# Patient Record
Sex: Female | Born: 1985 | Race: White | Hispanic: No | Marital: Married | Smoking: Never smoker
Health system: Southern US, Community
[De-identification: ages and names within clinical notes are randomized; demographics above are authoritative.]

## PROBLEM LIST (undated history)

## (undated) DIAGNOSIS — Z3400 Encounter for supervision of normal first pregnancy, unspecified trimester: Principal | ICD-10-CM

## (undated) HISTORY — DX: Encounter for supervision of normal first pregnancy, unspecified trimester: Z34.00

## (undated) HISTORY — PX: WISDOM TOOTH EXTRACTION: SHX21

---

## 2017-04-09 ENCOUNTER — Other Ambulatory Visit: Payer: Self-pay | Admitting: Obstetrics and Gynecology

## 2017-04-09 DIAGNOSIS — O219 Vomiting of pregnancy, unspecified: Secondary | ICD-10-CM | POA: Insufficient documentation

## 2017-04-09 MED ORDER — DOXYLAMINE-PYRIDOXINE 10-10 MG PO TBEC
1.0000 | DELAYED_RELEASE_TABLET | Freq: Two times a day (BID) | ORAL | 0 refills | Status: DC
Start: 2017-04-09 — End: 2017-11-25

## 2017-04-09 NOTE — Progress Notes (Signed)
[redacted]wks EGA with persistent nausea, daily vomiting Rx Diclegis for N/V

## 2017-04-25 ENCOUNTER — Encounter: Payer: Self-pay | Admitting: Advanced Practice Midwife

## 2017-04-25 ENCOUNTER — Other Ambulatory Visit (HOSPITAL_COMMUNITY)
Admission: RE | Admit: 2017-04-25 | Discharge: 2017-04-25 | Disposition: A | Discharge status: Home / Self Care | Payer: 59 | Source: Ambulatory Visit | Attending: Advanced Practice Midwife | Admitting: Advanced Practice Midwife

## 2017-04-25 ENCOUNTER — Ambulatory Visit (INDEPENDENT_AMBULATORY_CARE_PROVIDER_SITE_OTHER): Payer: 59 | Admitting: Advanced Practice Midwife

## 2017-04-25 VITALS — BP 119/76 | HR 83 | Ht 67.0 in | Wt 136.0 lb

## 2017-04-25 DIAGNOSIS — Z1379 Encounter for other screening for genetic and chromosomal anomalies: Secondary | ICD-10-CM

## 2017-04-25 DIAGNOSIS — Z23 Encounter for immunization: Secondary | ICD-10-CM | POA: Diagnosis not present

## 2017-04-25 DIAGNOSIS — Z113 Encounter for screening for infections with a predominantly sexual mode of transmission: Secondary | ICD-10-CM | POA: Diagnosis not present

## 2017-04-25 DIAGNOSIS — Z124 Encounter for screening for malignant neoplasm of cervix: Secondary | ICD-10-CM

## 2017-04-25 DIAGNOSIS — Z3687 Encounter for antenatal screening for uncertain dates: Secondary | ICD-10-CM | POA: Diagnosis not present

## 2017-04-25 DIAGNOSIS — Z3401 Encounter for supervision of normal first pregnancy, first trimester: Secondary | ICD-10-CM | POA: Diagnosis not present

## 2017-04-25 DIAGNOSIS — Z3400 Encounter for supervision of normal first pregnancy, unspecified trimester: Secondary | ICD-10-CM | POA: Insufficient documentation

## 2017-04-25 DIAGNOSIS — Z1388 Encounter for screening for disorder due to exposure to contaminants: Secondary | ICD-10-CM

## 2017-04-25 HISTORY — DX: Encounter for supervision of normal first pregnancy, unspecified trimester: Z34.00

## 2017-04-25 NOTE — Patient Instructions (Addendum)
Prenatal Care WHAT IS PRENATAL CARE? Prenatal care is the process of caring for a pregnant woman before she gives birth. Prenatal care makes sure that she and her baby remain as healthy as possible throughout pregnancy. Prenatal care may be provided by a midwife, family practice health care provider, or a childbirth and pregnancy specialist (obstetrician). Prenatal care may include physical examinations, testing, treatments, and education on nutrition, lifestyle, and social support services. WHY IS PRENATAL CARE SO IMPORTANT? Early and consistent prenatal care increases the chance that you and your baby will remain healthy throughout your pregnancy. This type of care also decreases a baby's risk of being born too early (prematurely), or being born smaller than expected (small for gestational age). Any underlying medical conditions you may have that could pose a risk during your pregnancy are discussed during prenatal care visits. You will also be monitored regularly for any new conditions that may arise during your pregnancy so they can be treated quickly and effectively. WHAT HAPPENS DURING PRENATAL CARE VISITS? Prenatal care visits may include the following: Discussion Tell your health care provider about any new signs or symptoms you have experienced since your last visit. These might include:  Nausea or vomiting.  Increased or decreased level of energy.  Difficulty sleeping.  Back or leg pain.  Weight changes.  Frequent urination.  Shortness of breath with physical activity.  Changes in your skin, such as the development of a rash or itchiness.  Vaginal discharge or bleeding.  Feelings of excitement or nervousness.  Changes in your baby's movements.  You may want to write down any questions or topics you want to discuss with your health care provider and bring them with you to your appointment. Examination During your first prenatal care visit, you will likely have a complete  physical exam. Your health care provider will often examine your vagina, cervix, and the position of your uterus, as well as check your heart, lungs, and other body systems. As your pregnancy progresses, your health care provider will measure the size of your uterus and your baby's position inside your uterus. He or she may also examine you for early signs of labor. Your prenatal visits may also include checking your blood pressure and, after about 10-12 weeks of pregnancy, listening to your baby's heartbeat. Testing Regular testing often includes:  Urinalysis. This checks your urine for glucose, protein, or signs of infection.  Blood count. This checks the levels of white and red blood cells in your body.  Tests for sexually transmitted infections (STIs). Testing for STIs at the beginning of pregnancy is routinely done and is required in many states.  Antibody testing. You will be checked to see if you are immune to certain illnesses, such as rubella, that can affect a developing fetus.  Glucose screen. Around 24-28 weeks of pregnancy, your blood glucose level will be checked for signs of gestational diabetes. Follow-up tests may be recommended.  Group B strep. This is a bacteria that is commonly found inside a woman's vagina. This test will inform your health care provider if you need an antibiotic to reduce the amount of this bacteria in your body prior to labor and childbirth.  Ultrasound. Many pregnant women undergo an ultrasound screening around 18-20 weeks of pregnancy to evaluate the health of the fetus and check for any developmental abnormalities.  HIV (human immunodeficiency virus) testing. Early in your pregnancy, you will be screened for HIV. If you are at high risk for HIV, this test may   be repeated during your third trimester of pregnancy.  You may be offered other testing based on your age, personal or family medical history, or other factors. HOW OFTEN SHOULD I PLAN TO SEE MY  HEALTH CARE PROVIDER FOR PRENATAL CARE? Your prenatal care check-up schedule depends on any medical conditions you have before, or develop during, your pregnancy. If you do not have any underlying medical conditions, you will likely be seen for checkups:  Monthly, during the first 6 months of pregnancy.  Twice a month during months 7 and 8 of pregnancy.  Weekly starting in the 9th month of pregnancy and until delivery.  If you develop signs of early labor or other concerning signs or symptoms, you may need to see your health care provider more often. Ask your health care provider what prenatal care schedule is best for you. WHAT CAN I DO TO KEEP MYSELF AND MY BABY AS HEALTHY AS POSSIBLE DURING MY PREGNANCY?  Take a prenatal vitamin containing 400 micrograms (0.4 mg) of folic acid every day. Your health care provider may also ask you to take additional vitamins such as iodine, vitamin D, iron, copper, and zinc.  Take 1500-2000 mg of calcium daily starting at your 20th week of pregnancy until you deliver your baby.  Make sure you are up to date on your vaccinations. Unless directed otherwise by your health care provider: ? You should receive a tetanus, diphtheria, and pertussis (Tdap) vaccination between the 27th and 36th week of your pregnancy, regardless of when your last Tdap immunization occurred. This helps protect your baby from whooping cough (pertussis) after he or she is born. ? You should receive an annual inactivated influenza vaccine (IIV) to help protect you and your baby from influenza. This can be done at any point during your pregnancy.  Eat a well-rounded diet that includes: ? Fresh fruits and vegetables. ? Lean proteins. ? Calcium-rich foods such as milk, yogurt, hard cheeses, and dark, leafy greens. ? Whole grain breads.  Do noteat seafood high in mercury, including: ? Swordfish. ? Tilefish. ? Shark. ? King mackerel. ? More than 6 oz tuna per week.  Do not  eat: ? Raw or undercooked meats or eggs. ? Unpasteurized foods, such as soft cheeses (brie, blue, or feta), juices, and milks. ? Lunch meats. ? Hot dogs that have not been heated until they are steaming.  Drink enough water to keep your urine clear or pale yellow. For many women, this may be 10 or more 8 oz glasses of water each day. Keeping yourself hydrated helps deliver nutrients to your baby and may prevent the start of pre-term uterine contractions.  Do not use any tobacco products including cigarettes, chewing tobacco, or electronic cigarettes. If you need help quitting, ask your health care provider.  Do not drink beverages containing alcohol. No safe level of alcohol consumption during pregnancy has been determined.  Do not use any illegal drugs. These can harm your developing baby or cause a miscarriage.  Ask your health care provider or pharmacist before taking any prescription or over-the-counter medicines, herbs, or supplements.  Limit your caffeine intake to no more than 200 mg per day.  Exercise. Unless told otherwise by your health care provider, try to get 30 minutes of moderate exercise most days of the week. Do not  do high-impact activities, contact sports, or activities with a high risk of falling, such as horseback riding or downhill skiing.  Get plenty of rest.  Avoid anything that raises your  body temperature, such as hot tubs and saunas.  If you own a cat, do not empty its litter box. Bacteria contained in cat feces can cause an infection called toxoplasmosis. This can result in serious harm to the fetus.  Stay away from chemicals such as insecticides, lead, mercury, and cleaning or paint products that contain solvents.  Do not have any X-rays taken unless medically necessary.  Take a childbirth and breastfeeding preparation class. Ask your health care provider if you need a referral or recommendation.  This information is not intended to replace advice given  to you by your health care provider. Make sure you discuss any questions you have with your health care provider. Document Released: 07/11/2003 Document Revised: 12/11/2015 Document Reviewed: 09/22/2013 Elsevier Interactive Patient Education  2017 [employer].   Breastfeeding Deciding to breastfeed is one of the best choices you can make for you and your baby. A change in hormones during pregnancy causes your breast tissue to grow and increases the number and size of your milk ducts. These hormones also allow proteins, sugars, and fats from your blood supply to make breast milk in your milk-producing glands. Hormones prevent breast milk from being released before your baby is born as well as prompt milk flow after birth. Once breastfeeding has begun, thoughts of your baby, as well as his or her sucking or crying, can stimulate the release of milk from your milk-producing glands. Benefits of breastfeeding For Your Baby  Your first milk (colostrum) helps your baby's digestive system function better.  There are antibodies in your milk that help your baby fight off infections.  Your baby has a lower incidence of asthma, allergies, and sudden infant death syndrome.  The nutrients in breast milk are better for your baby than infant formulas and are designed uniquely for your baby's needs.  Breast milk improves your baby's brain development.  Your baby is less likely to develop other conditions, such as childhood obesity, asthma, or type 2 diabetes mellitus.  For You  Breastfeeding helps to create a very special bond between you and your baby.  Breastfeeding is convenient. Breast milk is always available at the correct temperature and costs nothing.  Breastfeeding helps to burn calories and helps you lose the weight gained during pregnancy.  Breastfeeding makes your uterus contract to its prepregnancy size faster and slows bleeding (lochia) after you give birth.  Breastfeeding helps to  lower your risk of developing type 2 diabetes mellitus, osteoporosis, and breast or ovarian cancer later in life.  Signs that your baby is hungry Early Signs of Hunger  Increased alertness or activity.  Stretching.  Movement of the head from side to side.  Movement of the head and opening of the mouth when the corner of the mouth or cheek is stroked (rooting).  Increased sucking sounds, smacking lips, cooing, sighing, or squeaking.  Hand-to-mouth movements.  Increased sucking of fingers or hands.  Late Signs of Hunger  Fussing.  Intermittent crying.  Extreme Signs of Hunger Signs of extreme hunger will require calming and consoling before your baby will be able to breastfeed successfully. Do not wait for the following signs of extreme hunger to occur before you initiate breastfeeding:  Restlessness.  A loud, strong cry.  Screaming.  Breastfeeding basics Breastfeeding Initiation  Find a comfortable place to sit or lie down, with your neck and back well supported.  Place a pillow or rolled up blanket under your baby to bring him or her to the level of  your breast (if you are seated). Nursing pillows are specially designed to help support your arms and your baby while you breastfeed.  Make sure that your baby's abdomen is facing your abdomen.  Gently massage your breast. With your fingertips, massage from your chest wall toward your nipple in a circular motion. This encourages milk flow. You may need to continue this action during the feeding if your milk flows slowly.  Support your breast with 4 fingers underneath and your thumb above your nipple. Make sure your fingers are well away from your nipple and your baby's mouth.  Stroke your baby's lips gently with your finger or nipple.  When your baby's mouth is open wide enough, quickly bring your baby to your breast, placing your entire nipple and as much of the colored area around your nipple (areola) as possible into  your baby's mouth. ? More areola should be visible above your baby's upper lip than below the lower lip. ? Your baby's tongue should be between his or her lower gum and your breast.  Ensure that your baby's mouth is correctly positioned around your nipple (latched). Your baby's lips should create a seal on your breast and be turned out (everted).  It is common for your baby to suck about 2-3 minutes in order to start the flow of breast milk.  Latching Teaching your baby how to latch on to your breast properly is very important. An improper latch can cause nipple pain and decreased milk supply for you and poor weight gain in your baby. Also, if your baby is not latched onto your nipple properly, he or she may swallow some air during feeding. This can make your baby fussy. Burping your baby when you switch breasts during the feeding can help to get rid of the air. However, teaching your baby to latch on properly is still the best way to prevent fussiness from swallowing air while breastfeeding. Signs that your baby has successfully latched on to your nipple:  Silent tugging or silent sucking, without causing you pain.  Swallowing heard between every 3-4 sucks.  Muscle movement above and in front of his or her ears while sucking.  Signs that your baby has not successfully latched on to nipple:  Sucking sounds or smacking sounds from your baby while breastfeeding.  Nipple pain.  If you think your baby has not latched on correctly, slip your finger into the corner of your baby's mouth to break the suction and place it between your baby's gums. Attempt breastfeeding initiation again. Signs of Successful Breastfeeding Signs from your baby:  A gradual decrease in the number of sucks or complete cessation of sucking.  Falling asleep.  Relaxation of his or her body.  Retention of a small amount of milk in his or her mouth.  Letting go of your breast by himself or herself.  Signs from  you:  Breasts that have increased in firmness, weight, and size 1-3 hours after feeding.  Breasts that are softer immediately after breastfeeding.  Increased milk volume, as well as a change in milk consistency and color by the fifth day of breastfeeding.  Nipples that are not sore, cracked, or bleeding.  Signs That Your Pecola Leisure is Getting Enough Milk  Wetting at least 1-2 diapers during the first 24 hours after birth.  Wetting at least 5-6 diapers every 24 hours for the first week after birth. The urine should be clear or pale yellow by 5 days after birth.  Wetting 6-8 diapers every  24 hours as your baby continues to grow and develop.  At least 3 stools in a 24-hour period by age 17 days. The stool should be soft and yellow.  At least 3 stools in a 24-hour period by age 38 days. The stool should be seedy and yellow.  No loss of weight greater than 10% of birth weight during the first 50 days of age.  Average weight gain of 4-7 ounces (113-198 g) per week after age 32 days.  Consistent daily weight gain by age 17 days, without weight loss after the age of 2 weeks.  After a feeding, your baby may spit up a small amount. This is common. Breastfeeding frequency and duration Frequent feeding will help you make more milk and can prevent sore nipples and breast engorgement. Breastfeed when you feel the need to reduce the fullness of your breasts or when your baby shows signs of hunger. This is called "breastfeeding on demand." Avoid introducing a pacifier to your baby while you are working to establish breastfeeding (the first 4-6 weeks after your baby is born). After this time you may choose to use a pacifier. Research has shown that pacifier use during the first year of a baby's life decreases the risk of sudden infant death syndrome (SIDS). Allow your baby to feed on each breast as long as he or she wants. Breastfeed until your baby is finished feeding. When your baby unlatches or falls asleep  while feeding from the first breast, offer the second breast. Because newborns are often sleepy in the first few weeks of life, you may need to awaken your baby to get him or her to feed. Breastfeeding times will vary from baby to baby. However, the following rules can serve as a guide to help you ensure that your baby is properly fed:  Newborns (babies 26 weeks of age or younger) may breastfeed every 1-3 hours.  Newborns should not go longer than 3 hours during the day or 5 hours during the night without breastfeeding.  You should breastfeed your baby a minimum of 8 times in a 24-hour period until you begin to introduce solid foods to your baby at around 89 months of age.  Breast milk pumping Pumping and storing breast milk allows you to ensure that your baby is exclusively fed your breast milk, even at times when you are unable to breastfeed. This is especially important if you are going back to work while you are still breastfeeding or when you are not able to be present during feedings. Your lactation consultant can give you guidelines on how long it is safe to store breast milk. A breast pump is a machine that allows you to pump milk from your breast into a sterile bottle. The pumped breast milk can then be stored in a refrigerator or freezer. Some breast pumps are operated by hand, while others use electricity. Ask your lactation consultant which type will work best for you. Breast pumps can be purchased, but some hospitals and breastfeeding support groups lease breast pumps on a monthly basis. A lactation consultant can teach you how to hand express breast milk, if you prefer not to use a pump. Caring for your breasts while you breastfeed Nipples can become dry, cracked, and sore while breastfeeding. The following recommendations can help keep your breasts moisturized and healthy:  Avoid using soap on your nipples.  Wear a supportive bra. Although not required, special nursing bras and tank  tops are designed to allow access to  your breasts for breastfeeding without taking off your entire bra or top. Avoid wearing underwire-style bras or extremely tight bras.  Air dry your nipples for 3-38minutes after each feeding.  Use only cotton bra pads to absorb leaked breast milk. Leaking of breast milk between feedings is normal.  Use lanolin on your nipples after breastfeeding. Lanolin helps to maintain your skin's normal moisture barrier. If you use pure lanolin, you do not need to wash it off before feeding your baby again. Pure lanolin is not toxic to your baby. You may also hand express a few drops of breast milk and gently massage that milk into your nipples and allow the milk to air dry.  In the first few weeks after giving birth, some women experience extremely full breasts (engorgement). Engorgement can make your breasts feel heavy, warm, and tender to the touch. Engorgement peaks within 3-5 days after you give birth. The following recommendations can help ease engorgement:  Completely empty your breasts while breastfeeding or pumping. You may want to start by applying warm, moist heat (in the shower or with warm water-soaked hand towels) just before feeding or pumping. This increases circulation and helps the milk flow. If your baby does not completely empty your breasts while breastfeeding, pump any extra milk after he or she is finished.  Wear a snug bra (nursing or regular) or tank top for 1-2 days to signal your body to slightly decrease milk production.  Apply ice packs to your breasts, unless this is too uncomfortable for you.  Make sure that your baby is latched on and positioned properly while breastfeeding.  If engorgement persists after 48 hours of following these recommendations, contact your health care provider or a Advertising copywriter. Overall health care recommendations while breastfeeding  Eat healthy foods. Alternate between meals and snacks, eating 3 of each per  day. Because what you eat affects your breast milk, some of the foods may make your baby more irritable than usual. Avoid eating these foods if you are sure that they are negatively affecting your baby.  Drink milk, fruit juice, and water to satisfy your thirst (about 10 glasses a day).  Rest often, relax, and continue to take your prenatal vitamins to prevent fatigue, stress, and anemia.  Continue breast self-awareness checks.  Avoid chewing and smoking tobacco. Chemicals from cigarettes that pass into breast milk and exposure to secondhand smoke may harm your baby.  Avoid alcohol and drug use, including marijuana. Some medicines that may be harmful to your baby can pass through breast milk. It is important to ask your health care provider before taking any medicine, including all over-the-counter and prescription medicine as well as vitamin and herbal supplements. It is possible to become pregnant while breastfeeding. If birth control is desired, ask your health care provider about options that will be safe for your baby. Contact a health care provider if:  You feel like you want to stop breastfeeding or have become frustrated with breastfeeding.  You have painful breasts or nipples.  Your nipples are cracked or bleeding.  Your breasts are red, tender, or warm.  You have a swollen area on either breast.  You have a fever or chills.  You have nausea or vomiting.  You have drainage other than breast milk from your nipples.  Your breasts do not become full before feedings by the fifth day after you give birth.  You feel sad and depressed.  Your baby is too sleepy to eat well.  Your baby  is having trouble sleeping.  Your baby is wetting less than 3 diapers in a 24-hour period.  Your baby has less than 3 stools in a 24-hour period.  Your baby's skin or the white part of his or her eyes becomes yellow.  Your baby is not gaining weight by 76 days of age. Get help right away  if:  Your baby is overly tired (lethargic) and does not want to wake up and feed.  Your baby develops an unexplained fever. This information is not intended to replace advice given to you by your health care provider. Make sure you discuss any questions you have with your health care provider. Document Released: 07/08/2005 Document Revised: 12/20/2015 Document Reviewed: 12/30/2012 Elsevier Interactive Patient Education  2017 [employer].

## 2017-04-25 NOTE — Progress Notes (Signed)
  Subjective:    Denise Mercado is being seen today for her first obstetrical visit.  This is a planned pregnancy. She is at [redacted]w[redacted]d gestation. Her obstetrical history is significant for nulliparity. Relationship with FOB: spouse, living together. Patient does intend to breast feed. Pregnancy history fully reviewed.  Patient reports nausea. Controlled by [place].   Review of Systems:   Review of Systems  Constitutional: Negative for chills and fever.  Gastrointestinal: Positive for nausea. Negative for abdominal pain and vomiting.  Genitourinary: Negative for decreased urine volume, dysuria, frequency, hematuria, pelvic pain, vaginal bleeding and vaginal discharge.    Objective:     BP 119/76   Pulse 83   Ht  (1.702 m)   Wt 136 lb (61.7 kg)   LMP 02/15/2017   BMI 21.30 kg/m  Physical Exam  Nursing note and vitals reviewed. Constitutional: She is oriented to person, place, and time. She appears well-developed and well-nourished. No distress.  HENT:  Head: Normocephalic.  Eyes: No scleral icterus.  Neck: No thyroid mass and no thyromegaly present.  Cardiovascular: Normal rate, regular rhythm and normal heart sounds.   No murmur heard. Respiratory: Effort normal and breath sounds normal. No respiratory distress.  GI: Soft. There is no tenderness.  Genitourinary: No breast swelling, tenderness, discharge or bleeding. There is no lesion on the right labia. There is no lesion on the left labia. Uterus is enlarged. Uterus is not tender. Cervix exhibits no motion tenderness, no discharge and no friability. Right adnexum displays no mass, no tenderness and no fullness. Left adnexum displays no mass, no tenderness and no fullness. There is bleeding (scant-after Pap) in the vagina. No erythema or tenderness in the vagina. No vaginal discharge found.  Musculoskeletal: She exhibits no edema or tenderness.  Neurological: She is alert and oriented to person, place, and time.  Skin: Skin is  warm and dry.  Psychiatric: She has a normal mood and affect.    Maternal Exam:  Introitus: Vagina is negative for discharge.       Assessment:    Pregnancy: G1P0 Patient Active Problem List   Diagnosis Date Noted  . Supervision of normal first pregnancy, antepartum 04/25/2017  . Nausea and vomiting during pregnancy prior to [redacted] weeks gestation 04/09/2017     1. Supervision of normal first pregnancy, antepartum  - Cytology - PAP - Obstetric Panel, Including HIV - Culture, OB Urine - [place] MFM Fetal Nuchal Translucency; Future - Flu Vaccine QUAD 36+ mos IM (Fluarix, Quad PF) - AMB MFM GENETICS REFERRAL - Babyscripts Schedule Optimization - Cystic fibrosis gene test  2. Encounter for genetic screening for birth defect  - AMB MFM GENETICS REFERRAL  3. Screening for lead exposure  - Lead, blood (adult age 64 yrs or greater)    Plan:     Initial labs drawn. Prenatal vitamins. Problem list reviewed and updated. AFP3 discussed: ordered. Wants expanded carrier screening. (FOB is a Biochemist, clinical) Role of ultrasound in pregnancy discussed; fetal survey: requested. Amniocentesis discussed: not indicated. Follow up in 2 weeks. Discussed clinic routines, schedule of care and testing, genetic screening options, involvement of students and residents under the direct supervision of APPs and doctors and presence of female providers. Pt verbalized understanding. Baby Rx Optimized schedule   Dorathy Kinsman 04/25/2017

## 2017-04-25 NOTE — Progress Notes (Signed)
Bedside U/S shows single IUP with FHT of 176BPM and CRL is 33.35mm  GA [redacted]w[redacted]d

## 2017-04-28 LAB — OBSTETRIC PANEL, INCLUDING HIV
ANTIBODY SCREEN: NEGATIVE
BASOS ABS: 0 10*3/uL (ref 0.0–0.2)
BASOS: 0 %
EOS (ABSOLUTE): 0 10*3/uL (ref 0.0–0.4)
Eos: 0 %
HEMATOCRIT: 39.9 % (ref 34.0–46.6)
HIV SCREEN 4TH GENERATION: NONREACTIVE
Hemoglobin: 13.6 g/dL (ref 11.1–15.9)
Hepatitis B Surface Ag: NEGATIVE
Immature Grans (Abs): 0 10*3/uL (ref 0.0–0.1)
Immature Granulocytes: 0 %
LYMPHS ABS: 2.3 10*3/uL (ref 0.7–3.1)
Lymphs: 23 %
MCH: 31.8 pg (ref 26.6–33.0)
MCHC: 34.1 g/dL (ref 31.5–35.7)
MCV: 93 fL (ref 79–97)
MONOCYTES: 8 %
Monocytes Absolute: 0.8 10*3/uL (ref 0.1–0.9)
NEUTROS ABS: 6.8 10*3/uL (ref 1.4–7.0)
Neutrophils: 69 %
PLATELETS: 314 10*3/uL (ref 150–379)
RBC: 4.28 x10E6/uL (ref 3.77–5.28)
RDW: 13.3 % (ref 12.3–15.4)
RPR Ser Ql: NONREACTIVE
RUBELLA: 3.97 {index} (ref >0.99)
Rh Factor: POSITIVE
WBC: 10 10*3/uL (ref 3.4–10.8)

## 2017-04-28 LAB — LEAD, BLOOD (ADULT >= 16 YRS): Lead-Whole Blood: NOT DETECTED ug/dL (ref 0–4)

## 2017-04-29 LAB — CYTOLOGY - PAP
CHLAMYDIA, DNA PROBE: NEGATIVE
Diagnosis: NEGATIVE
NEISSERIA GONORRHEA: NEGATIVE

## 2017-04-30 LAB — URINE CULTURE, OB REFLEX

## 2017-04-30 LAB — CULTURE, OB URINE

## 2017-04-30 LAB — CYSTIC FIBROSIS GENE TEST

## 2017-05-05 ENCOUNTER — Encounter: Payer: Self-pay | Admitting: Advanced Practice Midwife

## 2017-05-05 ENCOUNTER — Telehealth: Payer: Self-pay | Admitting: *Deleted

## 2017-05-05 DIAGNOSIS — Z141 Cystic fibrosis carrier: Secondary | ICD-10-CM

## 2017-05-05 DIAGNOSIS — O09899 Supervision of other high risk pregnancies, unspecified trimester: Secondary | ICD-10-CM | POA: Insufficient documentation

## 2017-05-05 NOTE — Telephone Encounter (Signed)
LM on cell phone that she tested positive as a CF carrier.  Informed patient to call office and schedule a time for spouse to be tested as well.

## 2017-05-05 NOTE — Telephone Encounter (Signed)
-----   Message from [place], [place] sent at 05/05/2017  9:10 AM EDT ----- Pt is CF carrier. Recommend testing for FOB.

## 2017-05-08 ENCOUNTER — Encounter (HOSPITAL_COMMUNITY): Payer: Self-pay | Admitting: Advanced Practice Midwife

## 2017-05-12 ENCOUNTER — Encounter: Payer: 59 | Admitting: Advanced Practice Midwife

## 2017-05-13 ENCOUNTER — Encounter: Payer: Self-pay | Admitting: Obstetrics & Gynecology

## 2017-05-13 ENCOUNTER — Ambulatory Visit (INDEPENDENT_AMBULATORY_CARE_PROVIDER_SITE_OTHER): Payer: 59 | Admitting: Obstetrics & Gynecology

## 2017-05-13 DIAGNOSIS — Z3400 Encounter for supervision of normal first pregnancy, unspecified trimester: Secondary | ICD-10-CM

## 2017-05-13 DIAGNOSIS — Z3401 Encounter for supervision of normal first pregnancy, first trimester: Secondary | ICD-10-CM

## 2017-05-13 NOTE — Progress Notes (Signed)
   PRENATAL VISIT NOTE  Subjective:  Denise Mercado is a 31 y.o. MW G1P0 at 6520w3d being seen today for ongoing prenatal care.  She is currently monitored for the following issues for this low-risk pregnancy and has Nausea and vomiting during pregnancy prior to [redacted] weeks gestation; Supervision of normal first pregnancy, antepartum; and Cystic fibrosis carrier, antepartum on her problem list.  Patient reports no complaints.  Contractions: Not present. Vag. Bleeding: None.  Movement: Absent. Denies leaking of fluid.   The following portions of the patient's history were reviewed and updated as appropriate: allergies, current medications, past family history, past medical history, past social history, past surgical history and problem list. Problem list updated.  Objective:   Vitals:   05/13/17 1431  BP: 129/79  Pulse: 93  Weight: 141 lb (64 kg)    Fetal Status: Fetal Heart Rate (bpm): 157   Movement: Absent     General:  Alert, oriented and cooperative. Patient is in no acute distress.  Skin: Skin is warm and dry. No rash noted.   Cardiovascular: Normal heart rate noted  Respiratory: Normal respiratory effort, no problems with respiration noted  Abdomen: Soft, gravid, appropriate for gestational age.  Pain/Pressure: Absent     Pelvic: Cervical exam deferred        Extremities: Normal range of motion.  Edema: None  Mental Status:  Normal mood and affect. Normal behavior. Normal judgment and thought content.   Assessment and Plan:  Pregnancy: G1P0 at 5520w3d  1. Supervision of normal first pregnancy, antepartum - anatomy u/s for [redacted] weeks EGA  Preterm labor symptoms and general obstetric precautions including but not limited to vaginal bleeding, contractions, leaking of fluid and fetal movement were reviewed in detail with the patient. Please refer to After Visit Summary for other counseling recommendations.  No Follow-up on file.   Allie BossierMyra C Ryn Peine, MD

## 2017-05-13 NOTE — Addendum Note (Signed)
Addended by: Granville LewisLARK, Khrista Braun L on: 05/13/2017 03:33 PM   Modules accepted: Orders

## 2017-05-16 ENCOUNTER — Other Ambulatory Visit: Payer: Self-pay

## 2017-05-16 ENCOUNTER — Ambulatory Visit (HOSPITAL_COMMUNITY)
Admission: RE | Admit: 2017-05-16 | Discharge: 2017-05-16 | Disposition: A | Discharge status: Home / Self Care | Payer: 59 | Source: Ambulatory Visit | Attending: Advanced Practice Midwife | Admitting: Advanced Practice Midwife

## 2017-05-16 ENCOUNTER — Other Ambulatory Visit: Payer: Self-pay | Admitting: Advanced Practice Midwife

## 2017-05-16 ENCOUNTER — Encounter (HOSPITAL_COMMUNITY): Payer: Self-pay

## 2017-05-16 DIAGNOSIS — Z3A12 12 weeks gestation of pregnancy: Secondary | ICD-10-CM | POA: Diagnosis not present

## 2017-05-16 DIAGNOSIS — O09899 Supervision of other high risk pregnancies, unspecified trimester: Secondary | ICD-10-CM

## 2017-05-16 DIAGNOSIS — Z3682 Encounter for antenatal screening for nuchal translucency: Secondary | ICD-10-CM | POA: Diagnosis not present

## 2017-05-16 DIAGNOSIS — Z3400 Encounter for supervision of normal first pregnancy, unspecified trimester: Secondary | ICD-10-CM

## 2017-05-16 DIAGNOSIS — O09891 Supervision of other high risk pregnancies, first trimester: Secondary | ICD-10-CM | POA: Diagnosis not present

## 2017-05-16 DIAGNOSIS — Z141 Cystic fibrosis carrier: Principal | ICD-10-CM

## 2017-05-19 DIAGNOSIS — Z3A12 12 weeks gestation of pregnancy: Secondary | ICD-10-CM | POA: Insufficient documentation

## 2017-05-19 NOTE — Progress Notes (Signed)
Genetic Counseling  High-Risk Gestation Note  Appointment Date:  05/16/2017 Referred By: Manya Silvas, [place] Date of Birth:  03-17-1986 Partner:  Denise Mercado   Pregnancy History: G1P0 Estimated Date of Delivery: 11/22/17 Estimated Gestational Age: 46w6dAttending: BSeward Meth MD  I met with Ms. Denise Howtonand her partner, [name] for genetic counseling because routine cystic fibrosis carrier screening identified Denise Mercado a carrier for cystic fibrosis (CF) and the couple wanted to discuss additional genetic screening and testing options for the pregnancy.      In summary:  Discussed cystic fibrosis and autosomal recessive inheritance  Discussed availability of carrier screening for the father of the baby  [name] that he had CF carrier screening [facility](97 mutation panel) which was within normal limits  Thus, risk for CF in the pregnancy has been reduced  He declined CFTR gene sequencing at today's visit  Reviewed risks to the fetus prior to carrier screening for the father of the baby  Discussed option of prenatal diagnosis only when mutations have been identified in both parents  Declined amniocentesis at this time  Understand limitation of ultrasound in diagnosis of cf  No other family history concerns  Discussed age related risk for fetal aneuploidy  Discussed options for screening  First screen- declined  Quad screen  NIPS- elected to pursue Panorama today  Ultrasound- NT performed today  Discussed diagnostic testing options  Discussed additional carrier screening option of expanded pan-ethnic carrier screening- elected to pursue through [facility] today (176 conditions)   We reviewed the results of Ms. JDura MccormackCF carrier screening.  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 [name] that he had CF carrier screening through [facility](97 mutation panel), which was within normal  limits. Lab report not available at today's visit to confirm this report.   [name] no known individuals with cystic fibrosis in his family history, and consanguinity to Ms. JTanya Crotherswas denied. He reported [place] ancestry, and Ms. TWildermuthreported G[place]and Denise Mercado. Detailed pedigree cArchitectand family history analysis was declined at today's visit; both family histories reportedly negative for birth defects, intellectual disability, and known genetic conditions. Without further information regarding the provided family history, an accurate genetic risk cannot be calculated. Further genetic counseling is warranted if more information is obtained.  Classic features of CF include thickened secretions in the lungs, digestive and reproductive systems. This life-limiting condition is characterized by chronic respiratory infections requiring daily chest therapies and pancreatic dysfunction disrupting the body's ability to break down food and extract nutrients as it should, which may restrict growth. Infertility commonly occurs in males. With therapies, such as daily respiratory therapies and medications to aid digestion, the median lifespan for people with CF is now mid-40's. We discussed that more recent therapies include CFTR modulator therapies, which are medications designed to correct the function of the defective protein made by CFTR.  Treatment may involve lung transplantation in some cases. There can be significant variability in the severity of symptoms and expression of the disease. Expression of the disease depends upon the specific mutations present in an individual with CF.   We briefly reviewed the autosomal recessive inheritance of CF. Approximately 1 in 248Caucasians is a CF carrier. We discussed that individuals who are carriers have one copy of the CFTR gene with a disease causing mutation, and their other CFTR gene copy functions correctly. Thus, carriers  typically do not have associated medical symptoms. The couple is aware than when both parents are carriers for CF, each pregnancy has an independent chance for one of the following outcomes: a 25% chance to inherit both mutations and thus have CF; a 50% chance to inherit one gene mutation and be a carrier similar to parents; and a 25% chance to be neither a carrier nor have CF. When one parent is a CF carrier but the other is not, then each pregnancy has a 1 in 2 chance to be a CF carrier but would not be expected to be at increased risk to inherit CF.    There are known to be thousands of mutations which can cause the CFTR gene to not function properly. Carrier screening is available to assess for the most common disease causing mutations. However, carrier screening does not identify all CF carriers. Thus, a negative CF carrier screen would reduce, but not eliminate, the chance to be a CF carrier and thus the chance for CF in a pregnancy.  Carrier screening for the most common mutations detects approximately 90% of carriers in the Caucasian population. Given that [name] had negative CF carrier screening, his risk to be a CF carrier has been significantly reduced. We discussed that CFTR gene sequencing is available to further increase detection rate; he declined CFTR sequencing at today's visit. When both parents are identified to be CF carriers, prenatal diagnosis via amniocentesis would be available, if desired.  A fetus with cystic fibrosis typically appears normal on targeted ultrasound, although rarely echogenic bowel is visualized. However, the presence of echogenic bowel on targeted ultrasound is not diagnostic for CF in a pregnancy, nor does the absence of echogenic bowel on ultrasound rule out CF in the pregnancy. They understand that in [place], the newborn screening test will detect CF, but that carriers may come back as false positives.  The couple inquired about additional carrier  screening options, specifically expanded pan-ethnic carrier screening. They were counseled regarding ACOG recommended screening for cystic fibrosis, spinal muscular atrophy and hemoglobinopathies.  In addition, we discussed that there are a variety of genetic screening laboratories that have pan-ethnic, or expanded, carrier screening panels, which evaluate carrier status for a wide range of genetic conditions. Some of these conditions are severe and actionable, but also rare; others occur more commonly, but are less severe. We discussed that testing options range from screening for a single condition to panels of more than 200 autosomal or X-linked genetic conditions. We reviewed that the prevalence of each condition varies (and often varies with ethnicity). Thus the couples' background risk to be a carrier for each of these various conditions would range, and in some cases be very low or unknown. Similarly, the detection rate varies with each condition and also varies in some cases with ethnicity, ranging from greater than 99% (in the case of hemoglobinopathies) to unknown. We reviewed that a negative carrier screen would thus reduce, but not eliminate the chance to be a carrier for these conditions. For some conditions included on specific pan-ethnic carrier screening panels, the pre-test carrier frequency and/or the detection rate is unknown. Thus, for some conditions, the exact reduction of risk with a negative carrier screening result may not be able to be quantified. We reviewed that in the event that one partner is found to be a carrier for one or more conditions, carrier screening would be available to the partner for those conditions. We also discussed that typically carrier status  does not increase the risk for symptoms or additional medical features; however, there are some conditions for which being a carrier does increase the risk for associated medical implications. We discussed the risks, benefits,  and limitations of carrier screening with the couple. After thoughtful consideration of their options, Denise Mercado elected to have the universal carrier screening panel through [facility]. This panel screens for carrier status of 176 hereditary disorders. We discussed that results will be available in approximately 2 weeks.   This couple was counseled regarding the availability of screening for fetal aneuploidy. Considering Denise Mercado's maternal age of 31 y.o. and her negative family history, we discussed that she is considered to be in the low risk category for having a fetus with aneuploidy. We reviewed chromosomes, nondisjunction, and the associated risk for fetal aneuploidy related to a maternal age of 31 y.o. at 60w6dgestation. They were counseled that the risk for aneuploidy decreases as gestational age increases, accounting for those pregnancies which spontaneously abort. We briefly discussed common fetal chromosome conditions including the features and prognoses of each.   We reviewed available routine screening options including First Screen, Quad screen, nuchal translucency ultrasound and detailed ultrasound. They were counseled that screening tests are used to modify a patient's a priori risk for aneuploidy, typically based on age. This estimate provides a pregnancy specific risk assessment. We reviewed the benefits and limitations of each option. Specifically, we discussed the conditions for which each test screens, the detection rates, and false positive rates of each. They were also counseled regarding noninvasive prenatal screening (NIPS)/cell free DNA (cfDNA) screening and diagnostic testing via [facility] or amniocentesis. We discussed that while ACOG recommends that NIPS and diagnostic testing should be made available to all pregnant patients, ACOG also recommends that this testing be offered to those patients who are considered to have a "high" risk for fetal aneuploidy (i.e. those who are AMA, have  an abnormal maternal serum or combined screening result, have an abnormal finding by fetal ultrasound, or have a family history of a specific chromosome condition). We discussed the possible results that the tests might provide including: positive, negative, unanticipated, and no result. Finally, they were counseled regarding the cost of each option and potential out of pocket expenses. After careful consideration, Ms. TDorothea Ogleelected to pursue NIPS [facility] including the expanded panel for select microdeletion syndromes.   A nuchal translucency ultrasound was performed today. The report will be documented separately. Detailed ultrasound is available at approximately [redacted] weeks gestation. They understand that screening tests cannot rule out all birth defects or genetic syndromes. The patient was advised of this limitation and states she still does not want additional testing at this time.  I counseled this couple regarding the above risks and available options. The approximate face-to-face time with the genetic counselor was 30 minutes.   KChipper Oman MS Certified Genetic Counselor 05/19/2017 8:55 AM

## 2017-05-25 ENCOUNTER — Other Ambulatory Visit: Payer: Self-pay

## 2017-05-26 ENCOUNTER — Telehealth (HOSPITAL_COMMUNITY): Payer: Self-pay | Admitting: MS"

## 2017-05-26 NOTE — Telephone Encounter (Signed)
Called Denise Mercado Mercado to discuss her prenatal cell free DNA test results.  Denise Mercado had Panorama testing through [facility].  The patient was identified by name and DOB.  We reviewed that these are within normal limits, showing a less than 1 in 10,000 risk for trisomies 21, 18 and 13, and monosomy X (Turner syndrome).  In addition, the risk for triploidy and sex chromosome trisomies (47,XXX and 47,XXY) was also low risk.  Denise Mercado elected to have cffDNA analysis for select microdeletion syndrome panel, which was also low risk.  We reviewed that this testing identifies > 99% of pregnancies with trisomy 3721, trisomy 113, sex chromosome trisomies (47,XXX and 47,XXY), and triploidy. The detection rate for trisomy 18 is 96%.  The detection rate for monosomy X is ~92%.  The false positive rate is <0.1% for all conditions. Fetal sex was not disclosed on the report, per the couple's request.   She understands that this testing does not identify all genetic conditions.    Denise Mercado also had carrier screening for the expanded carrier screening panel through [facility]. We reviewed that the results are negative for all of the conditions for which analysis was performed, with the exception of cystic fibrosis, as the patient already knows from previous testing and for NPHS2-related Nephrotic syndrome.  We discussed this condition briefly. CFTR results were not discussed in detail, as these were discussed at previous genetic counseling visit.   She was counseled that Nephrotic Syndrome NPHS2 related is an inherited condition that impairs kidney function. 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 R229Q in the homozygous state is not reported to be disease causing. We reviewed the autosomal recessive inheritance and that carrier screening is available to her partner, if desired. Carrier frequency in the general  population is approximately 1 in 400. NPHS2 sequencing has greater than 99% detection rate for carriers.   The couple will contact us if they desire us to facilitate carrier screening for her partner.   We reviewed that carrier screening does not detect all carriers of all of these conditions, but a normal result significantly decreases the likelihood of being a carrier, and therefore, the overall reproductive risk. We reviewed that [facility] sequences most of the genes, which is associated with a high detection rate for carriers, thus a negative screen is very reassuring. All questions were answered to her satisfaction, she was encouraged to call with additional questions or concerns. All questions were answered to her satisfaction, she was encouraged to call with additional questions or concerns.  Denise PlowmanKaren Kylen Ismael, MS Certified Genetic Counselor 05/26/2017 12:07 PM

## 2017-05-26 NOTE — Telephone Encounter (Signed)
Left message for patient's partner, Mr. Lonie PeakSam Miller, to return call to discuss results. The patient previously requested that Mr. Hyacinth MeekerMiller be called with results of NIPS and [facility] carrier screening, once available.   Clydie BraunKaren Kenyia Wambolt 05/26/2017 11:52 AM

## 2017-06-27 ENCOUNTER — Ambulatory Visit (HOSPITAL_COMMUNITY)
Admission: RE | Admit: 2017-06-27 | Discharge: 2017-06-27 | Disposition: A | Discharge status: Home / Self Care | Payer: 59 | Source: Ambulatory Visit | Attending: Obstetrics & Gynecology | Admitting: Obstetrics & Gynecology

## 2017-06-27 DIAGNOSIS — Z3689 Encounter for other specified antenatal screening: Secondary | ICD-10-CM | POA: Insufficient documentation

## 2017-06-27 DIAGNOSIS — Z3400 Encounter for supervision of normal first pregnancy, unspecified trimester: Secondary | ICD-10-CM

## 2017-06-27 DIAGNOSIS — Z3A18 18 weeks gestation of pregnancy: Secondary | ICD-10-CM | POA: Diagnosis not present

## 2017-07-07 ENCOUNTER — Ambulatory Visit (INDEPENDENT_AMBULATORY_CARE_PROVIDER_SITE_OTHER): Payer: 59 | Admitting: Advanced Practice Midwife

## 2017-07-07 VITALS — BP 107/62 | HR 86 | Wt 148.0 lb

## 2017-07-07 DIAGNOSIS — Z141 Cystic fibrosis carrier: Secondary | ICD-10-CM

## 2017-07-07 DIAGNOSIS — O09899 Supervision of other high risk pregnancies, unspecified trimester: Secondary | ICD-10-CM

## 2017-07-07 DIAGNOSIS — Z3400 Encounter for supervision of normal first pregnancy, unspecified trimester: Secondary | ICD-10-CM

## 2017-07-07 DIAGNOSIS — Z0489 Encounter for examination and observation for other specified reasons: Secondary | ICD-10-CM

## 2017-07-07 DIAGNOSIS — Z3402 Encounter for supervision of normal first pregnancy, second trimester: Secondary | ICD-10-CM | POA: Diagnosis not present

## 2017-07-07 DIAGNOSIS — IMO0002 Reserved for concepts with insufficient information to code with codable children: Secondary | ICD-10-CM

## 2017-07-07 NOTE — Addendum Note (Signed)
Addended by: Granville LewisLARK, Chauncey Bruno L on: 07/07/2017 10:02 AM   Modules accepted: Orders

## 2017-07-07 NOTE — Progress Notes (Addendum)
   PRENATAL VISIT NOTE  Subjective:  Einar GradJanna Bruck is a 31 y.o. G1P0 at 998w2d being seen today for ongoing prenatal care.  She is currently monitored for the following issues for this low-risk pregnancy and has Nausea and vomiting during pregnancy prior to [redacted] weeks gestation; Supervision of normal first pregnancy, antepartum; Cystic fibrosis carrier, antepartum; and [redacted] weeks gestation of pregnancy on their problem list.  Patient reports no complaints.  Contractions: Not present. Vag. Bleeding: Scant.  Movement: Present. Denies leaking of fluid.   The following portions of the patient's history were reviewed and updated as appropriate: allergies, current medications, past family history, past medical history, past social history, past surgical history and problem list. Problem list updated.  Objective:   Vitals:   07/07/17 0827  BP: 107/62  Pulse: 86  Weight: 148 lb (67.1 kg)    Fetal Status: Fetal Heart Rate (bpm): 148 Fundal Height: 21 cm Movement: Present     General:  Alert, oriented and cooperative. Patient is in no acute distress.  Skin: Skin is warm and dry. No rash noted.   Cardiovascular: Normal heart rate noted  Respiratory: Normal respiratory effort, no problems with respiration noted  Abdomen: Soft, gravid, appropriate for gestational age.  Pain/Pressure: Absent     Pelvic: Cervical exam deferred        Extremities: Normal range of motion.  Edema: None  Mental Status:  Normal mood and affect. Normal behavior. Normal judgment and thought content.   Assessment and Plan:  Pregnancy: G1P0 at 9398w2d  1. Evaluate anatomy not seen on prior sonogram  - US MFM OB FOLLOW UP; Future  2. Cystic fibrosis carrier, antepartum  - US MFM OB FOLLOW UP; Future - FOB neg  3. Supervision of normal first pregnancy, antepartum  - US MFM OB FOLLOW UP; Future  Preterm labor symptoms and general obstetric precautions including but not limited to vaginal bleeding, contractions, leaking of  fluid and fetal movement were reviewed in detail with the patient. Please refer to After Visit Summary for other counseling recommendations.  Return in about 8 weeks (around 09/01/2017) for ROB/GTT.  Interested in Systems developerwaterbirth. Info given.    Dorathy KinsmanVirginia Daquana Paddock, CNM

## 2017-07-07 NOTE — Patient Instructions (Addendum)
[email or web address]  Considering Waterbirth? Guide for patients at [facility] [employer]  Why consider waterbirth?  . Gentle birth for babies . Less pain medicine used in labor . May allow for passive descent/less pushing . May reduce perineal tears  . More mobility and instinctive maternal position changes . Increased maternal relaxation . Reduced blood pressure in labor  Is waterbirth safe? What are the risks of infection, drowning or other complications?  . Infection: o Very low risk (3.7 % for tub vs 4.8% for bed) o 7 in 8000 waterbirths with documented infection o Poorly cleaned equipment most common cause o Slightly lower group B strep transmission rate  . Drowning o Maternal:  - Very low risk   - Related to seizures or fainting o Newborn:  - Very low risk. No evidence of increased risk of respiratory problems in multiple large studies - Physiological protection from breathing under water - Avoid underwater birth if there are any fetal complications - Once baby's head is out of the water, keep it out.  . Birth complication o Some reports of cord trauma, but risk decreased by bringing baby to surface gradually o No evidence of increased risk of shoulder dystocia. Mothers can usually change positions faster in water than in a bed, possibly aiding the maneuvers to free the shoulder.   You must attend a Doren Custard class at [facility]  3rd Wednesday of every month from 7-9pm  [employer] by calling [phone] or online at [email or web address]  Bring [place] the certificate from the class to your prenatal appointment  Meet with a midwife at 36 weeks to see if you can still plan a waterbirth and to sign the consent.   Purchase or rent the following supplies:   Water Birth Pool (Birth Pool in a Box or Monument for instance)  (Tubs start ~$125)  Single-use disposable tub liner designed for your brand of tub  New garden hose labeled "lead-free",  "suitable for drinking water",  Electric drain pump to remove water (We recommend 792 gallon per hour or greater pump.)   Separate garden hose to remove the dirty water  Fish net  Bathing suit top (optional)  Long-handled mirror (optional)  Places to purchase or rent supplies  [email or web address] for tub purchases and supplies  [email or web address] for tub purchases and supplies  The Labor Ladies (www.[email or web address].[email or web address]) $275 for tub rental/set-up & take down/kit   [employer] (http://www.fleming.com/.htm) Information regarding doulas (labor support) who provide pool rentals  Our practice has a Birth Pool in a Box tub at the hospital that you may borrow on a first-come-first-served basis. It is your responsibility to to set up, clean and break down the tub. We cannot guarantee the availability of this tub in advance. You are responsible for bringing all accessories listed above. If you do not have all necessary supplies you cannot have a waterbirth.    Things that would prevent you from having a waterbirth:  Premature, <37wks  Previous cesarean birth  Presence of thick meconium-stained fluid  Multiple gestation (Twins, triplets, etc.)  Uncontrolled diabetes or gestational diabetes requiring medication  Hypertension requiring medication or diagnosis of pre-eclampsia  Heavy vaginal bleeding  Non-reassuring fetal heart rate  Active infection (MRSA, etc.). Group B Strep is NOT a contraindication for  waterbirth.  If your labor has to be induced and induction method requires continuous  monitoring of the baby's heart rate  Other risks/issues identified by your obstetrical provider  Please remember that birth is unpredictable. Under  certain unforeseeable circumstances your provider may advise against giving birth in the tub. These decisions will be made on a case-by-case basis and with the safety of you and your baby as our highest  priority.  [facility] [place] [address], [place]  [address] Phone - [phone]   Fax - [phone]  [facility] [address] [place], [place] [address] Phone - [phone]   Fax - [place] [address] [place], [place]  [address] Phone - [phone]   Fax - [phone]  [place] [address], [place] [address] [place], [place]  [address] Phone - [phone]   Fax - [phone]  [place] [address] [place], [place]  [address] Phone - [phone]   Fax - [phone]  [facility] [address] [place], [place]  [address] Phone - [phone]   Fax - [place] [address] [place], [place]  [address] Phone - [phone]   Fax - [phone]  [place] [address] [place], [place] [address] [place], [place]  [address] Phone - [phone]   Fax - [place] [address] [place], [place]  [address] Phone - [phone]   Fax - [phone] [facility] [place]. [address] [place], [place]  [address] Phone - [phone]   Fax - [phone]  [place] [address] [place], [place]  [address] Phone - [phone]   Fax - [phone]  [facility] AT [place], [place], [place]  [address] Phone - [phone]   Fax - [place] [address] [place], [place]  [address] Phone - [phone]   Fax - [phone]  [facility] [address] [place], [place]  [address] Phone - [place], [place]  [address] Phone - [phone]   Fax - [place] [address] [place], [place]  [address] Phone - [phone]   Fax - [phone]  [place] [address] [place], [place]  [address] Phone - [phone]   Fax - [place]. [place], [place]  [address] Phone - [phone]   Fax - [place], [place], [place]  [address] Phone - [phone]   Fax - [place] [address], [place], [place]  [address] Phone - [phone]   Fax - [phone]  [place] [place] [address] [place], [place]  [address] Phone - [phone]   Fax - [place] [place], [place]  [address] Phone - [phone]   Fax - [phone]  [place] [address] [place], [place]  [address] Phone - [phone]   Fax - [phone] [place] [address] [place], [place]  [address] Phone - [phone]   Fax - [place] [address] [place], [place]  [address] Phone - [phone]   Fax - [place] [address], [place],   [address] Phone - [phone]   Fax -  [place] [address] Phone [phone]  Fax [phone]    Breastfeeding Deciding to breastfeed is one of the best choices you can make for you and your baby. A change in hormones during pregnancy causes your breast tissue to grow and increases the number and size of your milk ducts. These hormones also allow proteins, sugars, and fats from your blood supply to make breast milk in your milk-producing glands. Hormones prevent breast milk from being released before your baby is born as well as prompt milk flow after birth. Once  breastfeeding has begun, thoughts of your baby, as well as his or her sucking or crying, can stimulate the release of milk from your milk-producing glands. Benefits of breastfeeding For Your Baby  Your first milk (colostrum) helps your baby's digestive system function better.  There are antibodies in your milk that help your baby fight off infections.  Your baby has a lower incidence of asthma, allergies, and sudden infant death syndrome.  The nutrients in breast milk are better for your baby than infant formulas and are designed uniquely for your baby's needs.  Breast milk improves your baby's brain development.  Your baby is less likely to develop other conditions, such as childhood obesity, asthma, or type 2 diabetes mellitus.  For You  Breastfeeding helps to create a very special bond between you and your baby.  Breastfeeding is convenient. Breast milk is always available at the correct temperature and costs nothing.  Breastfeeding helps to burn calories and helps you lose the weight gained during pregnancy.  Breastfeeding makes your uterus contract to its prepregnancy size faster and slows bleeding (lochia) after you give birth.  Breastfeeding helps to lower your risk of developing type 2 diabetes mellitus, osteoporosis, and breast or ovarian cancer later in life.  Signs that your baby is hungry Early Signs of Hunger  Increased alertness or activity.  Stretching.  Movement of the head from side to side.  Movement of the head and opening of the mouth when the corner of the mouth or cheek is stroked (rooting).  Increased sucking sounds, smacking lips, cooing, sighing, or squeaking.  Hand-to-mouth movements.  Increased sucking of fingers or hands.  Late Signs of Hunger  Fussing.  Intermittent crying.  Extreme Signs of Hunger Signs of extreme hunger will require calming and consoling before your baby will be able to breastfeed successfully. Do not wait for the  following signs of extreme hunger to occur before you initiate breastfeeding:  Restlessness.  A loud, strong cry.  Screaming.  Breastfeeding basics Breastfeeding Initiation  Find a comfortable place to sit or lie down, with your neck and back well supported.  Place a pillow or rolled up blanket under your baby to bring him or her to the level of your breast (if you are seated). Nursing pillows are specially designed to help support your arms and your baby while you breastfeed.  Make sure that your baby's abdomen is facing your abdomen.  Gently massage your breast. With your fingertips, massage from your chest wall toward your nipple in a circular motion. This encourages milk flow. You may need to continue this action during the feeding if your milk flows slowly.  Support your breast with 4 fingers underneath and your thumb above your nipple. Make sure your fingers are well away from your nipple and your baby's mouth.  Stroke your baby's lips gently with your finger or nipple.  When your  baby's mouth is open wide enough, quickly bring your baby to your breast, placing your entire nipple and as much of the colored area around your nipple (areola) as possible into your baby's mouth. ? More areola should be visible above your baby's upper lip than below the lower lip. ? Your baby's tongue should be between his or her lower gum and your breast.  Ensure that your baby's mouth is correctly positioned around your nipple (latched). Your baby's lips should create a seal on your breast and be turned out (everted).  It is common for your baby to suck about 2-3 minutes in order to start the flow of breast milk.  Latching Teaching your baby how to latch on to your breast properly is very important. An improper latch can cause nipple pain and decreased milk supply for you and poor weight gain in your baby. Also, if your baby is not latched onto your nipple properly, he or she may swallow some air  during feeding. This can make your baby fussy. Burping your baby when you switch breasts during the feeding can help to get rid of the air. However, teaching your baby to latch on properly is still the best way to prevent fussiness from swallowing air while breastfeeding. Signs that your baby has successfully latched on to your nipple:  Silent tugging or silent sucking, without causing you pain.  Swallowing heard between every 3-4 sucks.  Muscle movement above and in front of his or her ears while sucking.  Signs that your baby has not successfully latched on to nipple:  Sucking sounds or smacking sounds from your baby while breastfeeding.  Nipple pain.  If you think your baby has not latched on correctly, slip your finger into the corner of your baby's mouth to break the suction and place it between your baby's gums. Attempt breastfeeding initiation again. Signs of Successful Breastfeeding Signs from your baby:  A gradual decrease in the number of sucks or complete cessation of sucking.  Falling asleep.  Relaxation of his or her body.  Retention of a small amount of milk in his or her mouth.  Letting go of your breast by himself or herself.  Signs from you:  Breasts that have increased in firmness, weight, and size 1-3 hours after feeding.  Breasts that are softer immediately after breastfeeding.  Increased milk volume, as well as a change in milk consistency and color by the fifth day of breastfeeding.  Nipples that are not sore, cracked, or bleeding.  Signs That Your Randel Books is Getting Enough Milk  Wetting at least 1-2 diapers during the first 24 hours after birth.  Wetting at least 5-6 diapers every 24 hours for the first week after birth. The urine should be clear or pale yellow by 5 days after birth.  Wetting 6-8 diapers every 24 hours as your baby continues to grow and develop.  At least 3 stools in a 24-hour period by age 13 days. The stool should be soft and  yellow.  At least 3 stools in a 24-hour period by age 26 days. The stool should be seedy and yellow.  No loss of weight greater than 10% of birth weight during the first 2 days of age.  Average weight gain of 4-7 ounces (113-198 g) per week after age 13 days.  Consistent daily weight gain by age 32 days, without weight loss after the age of 2 weeks.  After a feeding, your baby may spit up a small amount. This  is common. Breastfeeding frequency and duration Frequent feeding will help you make more milk and can prevent sore nipples and breast engorgement. Breastfeed when you feel the need to reduce the fullness of your breasts or when your baby shows signs of hunger. This is called "breastfeeding on demand." Avoid introducing a pacifier to your baby while you are working to establish breastfeeding (the first 4-6 weeks after your baby is born). After this time you may choose to use a pacifier. Research has shown that pacifier use during the first year of a baby's life decreases the risk of sudden infant death syndrome (SIDS). Allow your baby to feed on each breast as long as he or she wants. Breastfeed until your baby is finished feeding. When your baby unlatches or falls asleep while feeding from the first breast, offer the second breast. Because newborns are often sleepy in the first few weeks of life, you may need to awaken your baby to get him or her to feed. Breastfeeding times will vary from baby to baby. However, the following rules can serve as a guide to help you ensure that your baby is properly fed:  Newborns (babies 79 weeks of age or younger) may breastfeed every 1-3 hours.  Newborns should not go longer than 3 hours during the day or 5 hours during the night without breastfeeding.  You should breastfeed your baby a minimum of 8 times in a 24-hour period until you begin to introduce solid foods to your baby at around 46 months of age.  Breast milk pumping Pumping and storing breast milk  allows you to ensure that your baby is exclusively fed your breast milk, even at times when you are unable to breastfeed. This is especially important if you are going back to work while you are still breastfeeding or when you are not able to be present during feedings. Your lactation consultant can give you guidelines on how long it is safe to store breast milk. A breast pump is a machine that allows you to pump milk from your breast into a sterile bottle. The pumped breast milk can then be stored in a refrigerator or freezer. Some breast pumps are operated by hand, while others use electricity. Ask your lactation consultant which type will work best for you. Breast pumps can be purchased, but some hospitals and breastfeeding support groups lease breast pumps on a monthly basis. A lactation consultant can teach you how to hand express breast milk, if you prefer not to use a pump. Caring for your breasts while you breastfeed Nipples can become dry, cracked, and sore while breastfeeding. The following recommendations can help keep your breasts moisturized and healthy:  Avoid using soap on your nipples.  Wear a supportive bra. Although not required, special nursing bras and tank tops are designed to allow access to your breasts for breastfeeding without taking off your entire bra or top. Avoid wearing underwire-style bras or extremely tight bras.  Air dry your nipples for 3-59mnutes after each feeding.  Use only cotton bra pads to absorb leaked breast milk. Leaking of breast milk between feedings is normal.  Use lanolin on your nipples after breastfeeding. Lanolin helps to maintain your skin's normal moisture barrier. If you use pure lanolin, you do not need to wash it off before feeding your baby again. Pure lanolin is not toxic to your baby. You may also hand express a few drops of breast milk and gently massage that milk into your nipples and allow the milk  to air dry.  In the first few weeks after  giving birth, some women experience extremely full breasts (engorgement). Engorgement can make your breasts feel heavy, warm, and tender to the touch. Engorgement peaks within 3-5 days after you give birth. The following recommendations can help ease engorgement:  Completely empty your breasts while breastfeeding or pumping. You may want to start by applying warm, moist heat (in the shower or with warm water-soaked hand towels) just before feeding or pumping. This increases circulation and helps the milk flow. If your baby does not completely empty your breasts while breastfeeding, pump any extra milk after he or she is finished.  Wear a snug bra (nursing or regular) or tank top for 1-2 days to signal your body to slightly decrease milk production.  Apply ice packs to your breasts, unless this is too uncomfortable for you.  Make sure that your baby is latched on and positioned properly while breastfeeding.  If engorgement persists after 48 hours of following these recommendations, contact your health care provider or a Science writer. Overall health care recommendations while breastfeeding  Eat healthy foods. Alternate between meals and snacks, eating 3 of each per day. Because what you eat affects your breast milk, some of the foods may make your baby more irritable than usual. Avoid eating these foods if you are sure that they are negatively affecting your baby.  Drink milk, fruit juice, and water to satisfy your thirst (about 10 glasses a day).  Rest often, relax, and continue to take your prenatal vitamins to prevent fatigue, stress, and anemia.  Continue breast self-awareness checks.  Avoid chewing and smoking tobacco. Chemicals from cigarettes that pass into breast milk and exposure to secondhand smoke may harm your baby.  Avoid alcohol and drug use, including marijuana. Some medicines that may be harmful to your baby can pass through breast milk. It is important to ask your  health care provider before taking any medicine, including all over-the-counter and prescription medicine as well as vitamin and herbal supplements. It is possible to become pregnant while breastfeeding. If birth control is desired, ask your health care provider about options that will be safe for your baby. Contact a health care provider if:  You feel like you want to stop breastfeeding or have become frustrated with breastfeeding.  You have painful breasts or nipples.  Your nipples are cracked or bleeding.  Your breasts are red, tender, or warm.  You have a swollen area on either breast.  You have a fever or chills.  You have nausea or vomiting.  You have drainage other than breast milk from your nipples.  Your breasts do not become full before feedings by the fifth day after you give birth.  You feel sad and depressed.  Your baby is too sleepy to eat well.  Your baby is having trouble sleeping.  Your baby is wetting less than 3 diapers in a 24-hour period.  Your baby has less than 3 stools in a 24-hour period.  Your baby's skin or the white part of his or her eyes becomes yellow.  Your baby is not gaining weight by 52 days of age. Get help right away if:  Your baby is overly tired (lethargic) and does not want to wake up and feed.  Your baby develops an unexplained fever. This information is not intended to replace advice given to you by your health care provider. Make sure you discuss any questions you have with your health care provider. Document  Released: 07/08/2005 Document Revised: 12/20/2015 Document Reviewed: 12/30/2012 Elsevier Interactive Patient Education  2017 [employer].

## 2017-07-08 LAB — ALPHA FETOPROTEIN, MATERNAL
AFP MoM: 0.88
AFP, Serum: 51.2 ng/mL
Calc'd Gestational Age: 20.3 weeks
MATERNAL WT: 148 [lb_av]
Risk for ONTD: 1
TWINS-AFP: 1
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
## 2017-08-01 ENCOUNTER — Other Ambulatory Visit: Payer: Self-pay | Admitting: Advanced Practice Midwife

## 2017-08-01 ENCOUNTER — Ambulatory Visit (HOSPITAL_COMMUNITY)
Admission: RE | Admit: 2017-08-01 | Discharge: 2017-08-01 | Disposition: A | Discharge status: Home / Self Care | Payer: 59 | Source: Ambulatory Visit | Attending: Advanced Practice Midwife | Admitting: Advanced Practice Midwife

## 2017-08-01 DIAGNOSIS — Z141 Cystic fibrosis carrier: Secondary | ICD-10-CM | POA: Diagnosis not present

## 2017-08-01 DIAGNOSIS — O09899 Supervision of other high risk pregnancies, unspecified trimester: Secondary | ICD-10-CM

## 2017-08-01 DIAGNOSIS — Z362 Encounter for other antenatal screening follow-up: Secondary | ICD-10-CM

## 2017-08-01 DIAGNOSIS — Z3A23 23 weeks gestation of pregnancy: Secondary | ICD-10-CM | POA: Diagnosis not present

## 2017-08-01 DIAGNOSIS — Z3400 Encounter for supervision of normal first pregnancy, unspecified trimester: Secondary | ICD-10-CM

## 2017-08-01 DIAGNOSIS — IMO0002 Reserved for concepts with insufficient information to code with codable children: Secondary | ICD-10-CM

## 2017-08-01 DIAGNOSIS — Z0489 Encounter for examination and observation for other specified reasons: Secondary | ICD-10-CM

## 2017-08-01 DIAGNOSIS — O26892 Other specified pregnancy related conditions, second trimester: Secondary | ICD-10-CM | POA: Diagnosis not present

## 2017-09-12 ENCOUNTER — Ambulatory Visit (INDEPENDENT_AMBULATORY_CARE_PROVIDER_SITE_OTHER): Payer: 59 | Admitting: Advanced Practice Midwife

## 2017-09-12 VITALS — BP 117/66 | HR 91 | Wt 158.0 lb

## 2017-09-12 DIAGNOSIS — Z141 Cystic fibrosis carrier: Secondary | ICD-10-CM

## 2017-09-12 DIAGNOSIS — Z23 Encounter for immunization: Secondary | ICD-10-CM | POA: Diagnosis not present

## 2017-09-12 DIAGNOSIS — Z3400 Encounter for supervision of normal first pregnancy, unspecified trimester: Secondary | ICD-10-CM

## 2017-09-12 DIAGNOSIS — O09899 Supervision of other high risk pregnancies, unspecified trimester: Secondary | ICD-10-CM

## 2017-09-12 DIAGNOSIS — Z3480 Encounter for supervision of other normal pregnancy, unspecified trimester: Secondary | ICD-10-CM

## 2017-09-12 DIAGNOSIS — Z3483 Encounter for supervision of other normal pregnancy, third trimester: Secondary | ICD-10-CM

## 2017-09-12 DIAGNOSIS — Z029 Encounter for administrative examinations, unspecified: Secondary | ICD-10-CM

## 2017-09-12 NOTE — Patient Instructions (Addendum)
[facility] Denise Mercado [facility]  TDaP Vaccine Pregnancy Get the Whooping Cough Vaccine While You Are Pregnant (CDC)  It is important for women to get the whooping cough vaccine in the third trimester of each pregnancy. Vaccines are the best way to prevent this disease. There are 2 different whooping cough vaccines. Both vaccines combine protection against whooping cough, tetanus and diphtheria, but they are for different age groups: Tdap: for everyone 11 years or older, including pregnant women  DTaP: for children 2 months through 186 years of age  You need the whooping cough vaccine during each of your pregnancies The recommended time to get the shot is during your 27th through 36th week of pregnancy, preferably during the earlier part of this time period. The Centers for Disease Control and Prevention (CDC) recommends that pregnant women receive the whooping cough vaccine for adolescents and adults (called Tdap vaccine) during the third trimester of each pregnancy. The recommended time to get the shot is during your 27th through 36th week of pregnancy, preferably during the earlier part of this time period. This replaces the original recommendation that pregnant women get the vaccine only if they had not previously received it. The [employer] and the [employer] support this recommendation.  You should get the whooping cough vaccine while pregnant to pass protection to your baby frame support disabled and/or not supported in this browser  Learn why Denise RiegerLaura decided to get the whooping cough vaccine in her 3rd trimester of pregnancy and how her baby girl was born with some protection against the disease. Also available on YouTube. After receiving the whooping cough vaccine, your body will create protective antibodies (proteins produced by the body to fight off diseases) and pass some of them to your baby before birth. These  antibodies provide your baby some short-term protection against whooping cough in early life. These antibodies can also protect your baby from some of the more serious complications that come along with whooping cough. Your protective antibodies are at their highest about 2 weeks after getting the vaccine, but it takes time to pass them to your baby. So the preferred time to get the whooping cough vaccine is early in your third trimester. The amount of whooping cough antibodies in your body decreases over time. That is why CDC recommends you get a whooping cough vaccine during each pregnancy. Doing so allows each of your babies to get the greatest number of protective antibodies from you. This means each of your babies will get the best protection possible against this disease.  Getting the whooping cough vaccine while pregnant is better than getting the vaccine after you give birth Whooping cough vaccination during pregnancy is ideal so your baby will have short-term protection as soon as he is born. This early protection is important because your baby will not start getting his whooping cough vaccines until he is 2 months old. These first few months of life are when your baby is at greatest risk for catching whooping cough. This is also when he's at greatest risk for having severe, potentially life-threating complications from the infection. To avoid that gap in protection, it is best to get a whooping cough vaccine during pregnancy. You will then pass protection to your baby before he is born. To continue protecting your baby, he should get whooping cough vaccines starting at 2 months old. You may never have gotten the Tdap vaccine before and did not get it during this  pregnancy. If so, you should make sure to get the vaccine immediately after you give birth, before leaving the hospital or birthing center. It will take about 2 weeks before your body develops protection (antibodies) in response to the  vaccine. Once you have protection from the vaccine, you are less likely to give whooping cough to your newborn while caring for him. But remember, your baby will still be at risk for catching whooping cough from others. A recent study looked to see how effective Tdap was at preventing whooping cough in babies whose mothers got the vaccine while pregnant or in the hospital after giving birth. The study found that getting Tdap between 27 through 36 weeks of pregnancy is 85% more effective at preventing whooping cough in babies younger than 2 months old. Blood tests cannot tell if you need a whooping cough vaccine There are no blood tests that can tell you if you have enough antibodies in your body to protect yourself or your baby against whooping cough. Even if you have been sick with whooping cough in the past or previously received the vaccine, you still should get the vaccine during each pregnancy. Breastfeeding may pass some protective antibodies onto your baby By breastfeeding, you may pass some antibodies you have made in response to the vaccine to your baby. When you get a whooping cough vaccine during your pregnancy, you will have antibodies in your breast milk that you can share with your baby as soon as your milk comes in. However, your baby will not get protective antibodies immediately if you wait to get the whooping cough vaccine until after delivering your baby. This is because it takes about 2 weeks for your body to create antibodies. Learn more about the health benefits of breastfeeding.   Contraception Choices Contraception, also called birth control, refers to methods or devices that prevent pregnancy. Hormonal methods Contraceptive implant A contraceptive implant is a thin, plastic tube that contains a hormone. It is inserted into the upper part of the arm. It can remain in place for up to 3 years. Progestin-only injections Progestin-only injections are injections of progestin, a  synthetic form of the hormone progesterone. They are given every 3 months by a health care provider. Birth control pills Birth control pills are pills that contain hormones that prevent pregnancy. They must be taken once a day, preferably at the same time each day. Birth control patch The birth control patch contains hormones that prevent pregnancy. It is placed on the skin and must be changed once a week for three weeks and removed on the fourth week. A prescription is needed to use this method of contraception. Vaginal ring A vaginal ring contains hormones that prevent pregnancy. It is placed in the vagina for three weeks and removed on the fourth week. After that, the process is repeated with a new ring. A prescription is needed to use this method of contraception. Emergency contraceptive Emergency contraceptives prevent pregnancy after unprotected sex. They come in pill form and can be taken up to 5 days after sex. They work best the sooner they are taken after having sex. Most emergency contraceptives are available without a prescription. This method should not be used as your only form of birth control. Barrier methods Female condom A female condom is a thin sheath that is worn over the penis during sex. Condoms keep sperm from going inside a woman's body. They can be used with a spermicide to increase their effectiveness. They should be disposed after  a single use. Female condom A female condom is a soft, loose-fitting sheath that is put into the vagina before sex. The condom keeps sperm from going inside a woman's body. They should be disposed after a single use. Diaphragm A diaphragm is a soft, dome-shaped barrier. It is inserted into the vagina before sex, along with a spermicide. The diaphragm blocks sperm from entering the uterus, and the spermicide kills sperm. A diaphragm should be left in the vagina for 6-8 hours after sex and removed within 24 hours. A diaphragm is prescribed and fitted  by a health care provider. A diaphragm should be replaced every 1-2 years, after giving birth, after gaining more than 15 lb (6.8 kg), and after pelvic surgery. Cervical cap A cervical cap is a round, soft latex or plastic cup that fits over the cervix. It is inserted into the vagina before sex, along with spermicide. It blocks sperm from entering the uterus. The cap should be left in place for 6-8 hours after sex and removed within 48 hours. A cervical cap must be prescribed and fitted by a health care provider. It should be replaced every 2 years. Sponge A sponge is a soft, circular piece of polyurethane foam with spermicide on it. The sponge helps block sperm from entering the uterus, and the spermicide kills sperm. To use it, you make it wet and then insert it into the vagina. It should be inserted before sex, left in for at least 6 hours after sex, and removed and thrown away within 30 hours. Spermicides Spermicides are chemicals that kill or block sperm from entering the cervix and uterus. They can come as a cream, jelly, suppository, foam, or tablet. A spermicide should be inserted into the vagina with an applicator at least 10-15 minutes before sex to allow time for it to work. The process must be repeated every time you have sex. Spermicides do not require a prescription. Intrauterine contraception Intrauterine device (IUD) An IUD is a T-shaped device that is put in a woman's uterus. There are two types:  Hormone IUD.This type contains progestin, a synthetic form of the hormone progesterone. This type can stay in place for 3-5 years.  Copper IUD.This type is wrapped in copper wire. It can stay in place for 10 years.  Permanent methods of contraception Female tubal ligation In this method, a woman's fallopian tubes are sealed, tied, or blocked during surgery to prevent eggs from traveling to the uterus. Hysteroscopic sterilization In this method, a small, flexible insert is placed into  each fallopian tube. The inserts cause scar tissue to form in the fallopian tubes and block them, so sperm cannot reach an egg. The procedure takes about 3 months to be effective. Another form of birth control must be used during those 3 months. Female sterilization This is a procedure to tie off the tubes that carry sperm (vasectomy). After the procedure, the man can still ejaculate fluid (semen). Natural planning methods Natural family planning In this method, a couple does not have sex on days when the woman could become pregnant. Calendar method This means keeping track of the length of each menstrual cycle, identifying the days when pregnancy can happen, and not having sex on those days. Ovulation method In this method, a couple avoids sex during ovulation. Symptothermal method This method involves not having sex during ovulation. The woman typically checks for ovulation by watching changes in her temperature and in the consistency of cervical mucus. Post-ovulation method In this method, a couple  waits to have sex until after ovulation. Summary  Contraception, also called birth control, means methods or devices that prevent pregnancy.  Hormonal methods of contraception include implants, injections, pills, patches, vaginal rings, and emergency contraceptives.  Barrier methods of contraception can include female condoms, female condoms, diaphragms, cervical caps, sponges, and spermicides.  There are two types of IUDs (intrauterine devices). An IUD can be put in a woman's uterus to prevent pregnancy for 3-5 years.  Permanent sterilization can be done through a procedure for males, females, or both.  Natural family planning methods involve not having sex on days when the woman could become pregnant. This information is not intended to replace advice given to you by your health care provider. Make sure you discuss any questions you have with your health care provider. Document Released:  07/08/2005 Document Revised: 08/10/2016 Document Reviewed: 08/10/2016 Elsevier Interactive Patient Education  2018 [employer].

## 2017-09-12 NOTE — Progress Notes (Signed)
   PRENATAL VISIT NOTE  Subjective:  Denise Mercado is a 32 y.o. G1P0 at 711w6d being seen today for ongoing prenatal care.  She is currently monitored for the following issues for this low-risk pregnancy and has Nausea and vomiting during pregnancy prior to [redacted] weeks gestation; Supervision of normal first pregnancy, antepartum; and Cystic fibrosis carrier, antepartum on their problem list.  Patient reports no complaints.  Contractions: Regular. Vag. Bleeding: None.  Movement: Absent. Denies leaking of fluid.   The following portions of the patient's history were reviewed and updated as appropriate: allergies, current medications, past family history, past medical history, past social history, past surgical history and problem list. Problem list updated.  Objective:   Vitals:   09/12/17 0824  BP: 117/66  Pulse: 91  Weight: 158 lb (71.7 kg)    Fetal Status: Fetal Heart Rate (bpm): 154 Fundal Height: 29 cm Movement: Absent     General:  Alert, oriented and cooperative. Patient is in no acute distress.  Skin: Skin is warm and dry. No rash noted.   Cardiovascular: Normal heart rate noted  Respiratory: Normal respiratory effort, no problems with respiration noted  Abdomen: Soft, gravid, appropriate for gestational age.  Pain/Pressure: Absent     Pelvic: Cervical exam deferred        Extremities: Normal range of motion.  Edema: None  Mental Status:  Normal mood and affect. Normal behavior. Normal judgment and thought content.   Assessment and Plan:  Pregnancy: G1P0 at 491w6d  1. Supervision of other normal pregnancy, antepartum  - HIV antibody (with reflex) - CBC - RPR - Tdap vaccine greater than or equal to 7yo IM - 2Hr GTT w/ 1 Hr Carpenter 75 g - Signed up for waterbirth class  2. Supervision of normal first pregnancy, antepartum   3. Cystic fibrosis carrier, antepartum   Preterm labor symptoms and general obstetric precautions including but not limited to vaginal bleeding,  contractions, leaking of fluid and fetal movement were reviewed in detail with the patient. Please refer to After Visit Summary for other counseling recommendations.  Return in about 2 weeks (around 09/26/2017) for ROB.   Dorathy KinsmanVirginia Naida Escalante, CNM

## 2017-09-15 ENCOUNTER — Telehealth: Payer: Self-pay

## 2017-09-15 LAB — CBC
HEMATOCRIT: 31 % — AB (ref 35.0–45.0)
HEMOGLOBIN: 10.6 g/dL — AB (ref 11.7–15.5)
MCH: 32 pg (ref 27.0–33.0)
MCHC: 34.2 g/dL (ref 32.0–36.0)
MCV: 93.7 fL (ref 80.0–100.0)
MPV: 10.1 fL (ref 7.5–12.5)
Platelets: 293 10*3/uL (ref 140–400)
RBC: 3.31 10*6/uL — ABNORMAL LOW (ref 3.80–5.10)
RDW: 11.2 % (ref 11.0–15.0)
WBC: 8.9 10*3/uL (ref 3.8–10.8)

## 2017-09-15 LAB — 2HR GTT W 1 HR, CARPENTER, 75 G
GLUCOSE, FASTING, GEST: 74 mg/dL (ref 65–91)
Glucose, 1 Hr, Gest: 136 mg/dL (ref 65–179)
Glucose, 2 Hr, Gest: 101 mg/dL (ref 65–152)

## 2017-09-15 LAB — HIV ANTIBODY (ROUTINE TESTING W REFLEX): HIV 1&2 Ab, 4th Generation: NONREACTIVE

## 2017-09-15 LAB — RPR: RPR Ser Ql: NONREACTIVE

## 2017-09-15 NOTE — Telephone Encounter (Signed)
Called pt to inform her that she passed her 2 hour GTT. She did not answer and voice mailbox was full so I was unable to leave a message

## 2017-09-15 NOTE — Telephone Encounter (Signed)
Spoke with pt and she is aware that she passed her 2 hour GTT 

## 2017-09-30 ENCOUNTER — Encounter: Payer: Self-pay | Admitting: Obstetrics and Gynecology

## 2017-09-30 ENCOUNTER — Ambulatory Visit (INDEPENDENT_AMBULATORY_CARE_PROVIDER_SITE_OTHER): Payer: 59 | Admitting: Obstetrics and Gynecology

## 2017-09-30 VITALS — BP 118/65 | HR 89 | Wt 161.0 lb

## 2017-09-30 DIAGNOSIS — O09899 Supervision of other high risk pregnancies, unspecified trimester: Secondary | ICD-10-CM

## 2017-09-30 DIAGNOSIS — Z3400 Encounter for supervision of normal first pregnancy, unspecified trimester: Secondary | ICD-10-CM

## 2017-09-30 DIAGNOSIS — Z141 Cystic fibrosis carrier: Secondary | ICD-10-CM

## 2017-09-30 MED ORDER — COMFORT FIT MATERNITY SUPP MED MISC: 0 refills | Status: DC

## 2017-09-30 NOTE — Progress Notes (Signed)
   PRENATAL VISIT NOTE  Subjective:  Denise GradJanna Mercado is a 32 y.o. G1P0 at 756w3d being seen today for ongoing prenatal care.  She is currently monitored for the following issues for this low-risk pregnancy and has Nausea and vomiting during pregnancy prior to [redacted] weeks gestation; Supervision of normal first pregnancy, antepartum; and Cystic fibrosis carrier, antepartum on their problem list.  Patient reports symphysis pubis pain.  Contractions: Not present. Vag. Bleeding: None.  Movement: Present. Denies leaking of fluid.   The following portions of the patient's history were reviewed and updated as appropriate: allergies, current medications, past family history, past medical history, past social history, past surgical history and problem list. Problem list updated.  Objective:   Vitals:   09/30/17 1459  BP: 118/65  Pulse: 89  Weight: 161 lb (73 kg)    Fetal Status: Fetal Heart Rate (bpm): 144 Fundal Height: 32 cm Movement: Present     General:  Alert, oriented and cooperative. Patient is in no acute distress.  Skin: Skin is warm and dry. No rash noted.   Cardiovascular: Normal heart rate noted  Respiratory: Normal respiratory effort, no problems with respiration noted  Abdomen: Soft, gravid, appropriate for gestational age.  Pain/Pressure: Present     Pelvic: Cervical exam deferred        Extremities: Normal range of motion.  Edema: None  Mental Status:  Normal mood and affect. Normal behavior. Normal judgment and thought content.   Assessment and Plan:  Pregnancy: G1P0 at 3356w3d  1. Supervision of normal first pregnancy, antepartum Patient is doing well Rx maternity support belt provided to help with pelvic pain Patient signed up for water birth class next month Reviewed third trimester lab results with the patient  2. Cystic fibrosis carrier, antepartum FOB not carrier  Preterm labor symptoms and general obstetric precautions including but not limited to vaginal bleeding,  contractions, leaking of fluid and fetal movement were reviewed in detail with the patient. Please refer to After Visit Summary for other counseling recommendations.  Return in about 4 weeks (around 10/28/2017) for ROB.   Catalina AntiguaPeggy Reverie Vaquera, MD

## 2017-10-27 ENCOUNTER — Ambulatory Visit (INDEPENDENT_AMBULATORY_CARE_PROVIDER_SITE_OTHER): Payer: 59

## 2017-10-27 VITALS — BP 128/81 | HR 83 | Wt 161.0 lb

## 2017-10-27 DIAGNOSIS — Z3403 Encounter for supervision of normal first pregnancy, third trimester: Secondary | ICD-10-CM

## 2017-10-27 DIAGNOSIS — Z3400 Encounter for supervision of normal first pregnancy, unspecified trimester: Secondary | ICD-10-CM

## 2017-10-27 DIAGNOSIS — Z113 Encounter for screening for infections with a predominantly sexual mode of transmission: Secondary | ICD-10-CM | POA: Diagnosis not present

## 2017-10-27 DIAGNOSIS — Z3689 Encounter for other specified antenatal screening: Secondary | ICD-10-CM | POA: Diagnosis not present

## 2017-10-27 LAB — OB RESULTS CONSOLE GBS: STREP GROUP B AG: NEGATIVE

## 2017-10-27 NOTE — Progress Notes (Signed)
   PRENATAL VISIT NOTE  Subjective:  Denise Mercado is a 32 y.o. G1P0 at 7934w2d being seen today for ongoing prenatal care.  She is currently monitored for the following issues for this low-risk pregnancy and has Nausea and vomiting during pregnancy prior to [redacted] weeks gestation; Supervision of normal first pregnancy, antepartum; and Cystic fibrosis carrier, antepartum on their problem list.  Patient reports no complaints.  Contractions: Not present. Vag. Bleeding: None.  Movement: Present. Denies leaking of fluid.   The following portions of the patient's history were reviewed and updated as appropriate: allergies, current medications, past family history, past medical history, past social history, past surgical history and problem list. Problem list updated.  Objective:   Vitals:   10/27/17 0940  BP: 128/81  Pulse: 83  Weight: 161 lb (73 kg)    Fetal Status: Fetal Heart Rate (bpm): 148 Fundal Height: 36 cm Movement: Present  Presentation: Vertex  General:  Alert, oriented and cooperative. Patient is in no acute distress.  Skin: Skin is warm and dry. No rash noted.   Cardiovascular: Normal heart rate noted  Respiratory: Normal respiratory effort, no problems with respiration noted  Abdomen: Soft, gravid, appropriate for gestational age.  Pain/Pressure: Absent     Pelvic: Cervical exam performed Dilation: 1 Effacement (%): 70 Station: -3  Extremities: Normal range of motion.  Edema: None  Mental Status: Normal mood and affect. Normal behavior. Normal judgment and thought content.   Assessment and Plan:  Pregnancy: G1P0 at 2734w2d  1. Supervision of normal first pregnancy, antepartum - No complaints. Routine care - Waterbirth class completed and consent signed today  - GC/Chlamydia [facility] amp [facility] - Culture, beta strep (group b only)  Preterm labor symptoms and general obstetric precautions including but not limited to vaginal bleeding, contractions, leaking of fluid  and fetal movement were reviewed in detail with the patient. Please refer to After Visit Summary for other counseling recommendations.  Return in about 2 weeks (around 11/10/2017) for Return OB visit.  Future Appointments  Date Time Provider Department Center  11/10/2017  1:15 PM Allie Bossierove, Myra C, [facility]  11/17/2017  1:15 PM Allie Bossierove, Myra C, [facility]    Rolm BookbinderCaroline M Neill, [place]  10/27/17 10:08 AM

## 2017-10-28 LAB — GC/CHLAMYDIA PROBE AMP (~~LOC~~) NOT AT ARMC
Chlamydia: NEGATIVE
Neisseria Gonorrhea: NEGATIVE

## 2017-10-30 LAB — CULTURE, BETA STREP (GROUP B ONLY)
MICRO NUMBER:: 90430953
SPECIMEN QUALITY:: ADEQUATE

## 2017-11-10 ENCOUNTER — Ambulatory Visit (INDEPENDENT_AMBULATORY_CARE_PROVIDER_SITE_OTHER): Payer: 59 | Admitting: Advanced Practice Midwife

## 2017-11-10 ENCOUNTER — Encounter: Payer: Self-pay | Admitting: Advanced Practice Midwife

## 2017-11-10 ENCOUNTER — Encounter: Payer: 59 | Admitting: Obstetrics & Gynecology

## 2017-11-10 VITALS — BP 126/78 | HR 68 | Wt 164.0 lb

## 2017-11-10 DIAGNOSIS — Z3400 Encounter for supervision of normal first pregnancy, unspecified trimester: Secondary | ICD-10-CM

## 2017-11-10 NOTE — Progress Notes (Signed)
   PRENATAL VISIT NOTE  Subjective:  Denise Mercado is a 32 y.o. G1P0 at 6057w2d being seen today for ongoing prenatal care.  She is currently monitored for the following issues for this low-risk pregnancy and has Nausea and vomiting during pregnancy prior to [redacted] weeks gestation; Supervision of normal first pregnancy, antepartum; and Cystic fibrosis carrier, antepartum on their problem list.  Patient reports occasional contractions.  Contractions: Not present. Vag. Bleeding: None.  Movement: Present. Denies leaking of fluid.   The following portions of the patient's history were reviewed and updated as appropriate: allergies, current medications, past family history, past medical history, past social history, past surgical history and problem list. Problem list updated.  Objective:   Vitals:   11/10/17 0946  BP: 126/78  Pulse: 68  Weight: 164 lb (74.4 kg)    Fetal Status: Fetal Heart Rate (bpm): 142 Fundal Height: 36 cm Movement: Present     General:  Alert, oriented and cooperative. Patient is in no acute distress.  Skin: Skin is warm and dry. No rash noted.   Cardiovascular: Normal heart rate noted  Respiratory: Normal respiratory effort, no problems with respiration noted  Abdomen: Soft, gravid, appropriate for gestational age.  Pain/Pressure: Absent     Pelvic: Cervical exam deferred        Extremities: Normal range of motion.  Edema: None  Mental Status: Normal mood and affect. Normal behavior. Normal judgment and thought content.   Assessment and Plan:  Pregnancy: G1P0 at 5657w2d Discussed weekly visits.  IOL at 41 weeks if not delivered  Term labor symptoms and general obstetric precautions including but not limited to vaginal bleeding, contractions, leaking of fluid and fetal movement were reviewed in detail with the patient. Please refer to After Visit Summary for other counseling recommendations.  RTO 1 week  Future Appointments  Date Time Provider Department Center    11/17/2017  9:25 AM Leftwich-Kirby, Wilmer FloorLisa A, CNM [facility]    Wynelle BourgeoisMarie Kyjuan Gause, CNM

## 2017-11-10 NOTE — Progress Notes (Incomplete)
   PRENATAL VISIT NOTE  Subjective:  Denise Mercado is a 32 y.o. G1P0 at 5464w2d being seen today for ongoing prenatal care.  She is currently monitored for the following issues for this {Blank single:19197::"high-risk","low-risk"} pregnancy and has Nausea and vomiting during pregnancy prior to [redacted] weeks gestation; Supervision of normal first pregnancy, antepartum; and Cystic fibrosis carrier, antepartum on their problem list.  Patient reports {sx:[id number]}.  Contractions: Not present. Vag. Bleeding: None.  Movement: Present. Denies leaking of fluid.   The following portions of the patient's history were reviewed and updated as appropriate: allergies, current medications, past family history, past medical history, past social history, past surgical history and problem list. Problem list updated.  Objective:   Vitals:   11/10/17 0946  BP: 126/78  Pulse: 68  Weight: 164 lb (74.4 kg)    Fetal Status: Fetal Heart Rate (bpm): 142 Fundal Height: 36 cm Movement: Present     General:  Alert, oriented and cooperative. Patient is in no acute distress.  Skin: Skin is warm and dry. No rash noted.   Cardiovascular: Normal heart rate noted  Respiratory: Normal respiratory effort, no problems with respiration noted  Abdomen: Soft, gravid, appropriate for gestational age.  Pain/Pressure: Absent     Pelvic: {Blank single:19197::"Cervical exam performed","Cervical exam deferred"}        Extremities: Normal range of motion.  Edema: None  Mental Status: Normal mood and affect. Normal behavior. Normal judgment and thought content.   Assessment and Plan:  Pregnancy: G1P0 at 6064w2d  There are no diagnoses linked to this encounter. {Blank single:19197::"Term","Preterm"} labor symptoms and general obstetric precautions including but not limited to vaginal bleeding, contractions, leaking of fluid and fetal movement were reviewed in detail with the patient. Please refer to After Visit Summary for other counseling  recommendations.  No follow-ups on file.  Future Appointments  Date Time Provider Department Center  11/17/2017  9:25 AM Leftwich-Kirby, Wilmer FloorLisa A, CNM [facility]    Wynelle BourgeoisMarie Loden Laurent, CNM

## 2017-11-10 NOTE — Patient Instructions (Signed)

## 2017-11-11 ENCOUNTER — Encounter: Payer: 59 | Admitting: Obstetrics & Gynecology

## 2017-11-17 ENCOUNTER — Encounter: Payer: 59 | Admitting: Obstetrics & Gynecology

## 2017-11-17 ENCOUNTER — Ambulatory Visit (INDEPENDENT_AMBULATORY_CARE_PROVIDER_SITE_OTHER): Payer: 59 | Admitting: Advanced Practice Midwife

## 2017-11-17 VITALS — BP 125/74 | HR 97 | Wt 164.0 lb

## 2017-11-17 DIAGNOSIS — Z3403 Encounter for supervision of normal first pregnancy, third trimester: Secondary | ICD-10-CM

## 2017-11-17 DIAGNOSIS — Z3400 Encounter for supervision of normal first pregnancy, unspecified trimester: Secondary | ICD-10-CM

## 2017-11-17 NOTE — Patient Instructions (Signed)

## 2017-11-17 NOTE — Progress Notes (Signed)
   PRENATAL VISIT NOTE  Subjective:  Denise Mercado is a 32 y.o. G1P0 at [redacted]w[redacted]d being seen today for ongoing prenatal care.  She is currently monitored for the following issues for this low-risk pregnancy and has Nausea and vomiting during pregnancy prior to [redacted] weeks gestation; Supervision of normal first pregnancy, antepartum; and Cystic fibrosis carrier, antepartum on their problem list.  Patient reports occasional contractions.  Contractions: Irritability. Vag. Bleeding: None.  Movement: Present. Denies leaking of fluid.   The following portions of the patient's history were reviewed and updated as appropriate: allergies, current medications, past family history, past medical history, past social history, past surgical history and problem list. Problem list updated.  Objective:   Vitals:   11/17/17 0917  BP: 125/74  Pulse: 97  Weight: 164 lb (74.4 kg)    Fetal Status: Fetal Heart Rate (bpm): 154   Movement: Present  Presentation: Vertex  General:  Alert, oriented and cooperative. Patient is in no acute distress.  Skin: Skin is warm and dry. No rash noted.   Cardiovascular: Normal heart rate noted  Respiratory: Normal respiratory effort, no problems with respiration noted  Abdomen: Soft, gravid, appropriate for gestational age.  Pain/Pressure: Absent     Pelvic: Cervical exam performed Dilation: 1.5 Effacement (%): 70 Station: -2  Extremities: Normal range of motion.  Edema: None  Mental Status: Normal mood and affect. Normal behavior. Normal judgment and thought content.   Assessment and Plan:  Pregnancy: G1P0 at [redacted]w[redacted]d  1. Supervision of normal first pregnancy, antepartum --Anticipatory guidance about next weeks of pregnancy, next prenatal visits.  Pt plans waterbirth.  Discussed sweeping membranes but pt declines today, will consider at next visit.  Discussed ways to get ready for labor/avoid postdates induction including evening primrose oil and the [employer] for optimum fetal  positioning.    Term labor symptoms and general obstetric precautions including but not limited to vaginal bleeding, contractions, leaking of fluid and fetal movement were reviewed in detail with the patient. Please refer to After Visit Summary for other counseling recommendations.  Return in about 1 week (around 11/24/2017).  Future Appointments  Date Time Provider Department Center  11/24/2017  8:45 AM Aviva Signs, CNM [facility]    Sharen Counter, CNM

## 2017-11-23 ENCOUNTER — Encounter (HOSPITAL_COMMUNITY): Payer: Self-pay

## 2017-11-23 ENCOUNTER — Inpatient Hospital Stay (HOSPITAL_COMMUNITY)
Admission: AD | Admit: 2017-11-23 | Discharge: 2017-11-25 | DRG: 807 | Disposition: A | Discharge status: Home / Self Care | Payer: 59 | Source: Ambulatory Visit | Attending: Obstetrics & Gynecology | Admitting: Obstetrics & Gynecology

## 2017-11-23 DIAGNOSIS — Z3400 Encounter for supervision of normal first pregnancy, unspecified trimester: Secondary | ICD-10-CM

## 2017-11-23 DIAGNOSIS — Z880 Allergy status to penicillin: Secondary | ICD-10-CM

## 2017-11-23 DIAGNOSIS — O09899 Supervision of other high risk pregnancies, unspecified trimester: Secondary | ICD-10-CM

## 2017-11-23 DIAGNOSIS — Z141 Cystic fibrosis carrier: Secondary | ICD-10-CM

## 2017-11-23 DIAGNOSIS — Z3A40 40 weeks gestation of pregnancy: Secondary | ICD-10-CM | POA: Diagnosis not present

## 2017-11-23 DIAGNOSIS — Z3483 Encounter for supervision of other normal pregnancy, third trimester: Secondary | ICD-10-CM | POA: Diagnosis present

## 2017-11-23 DIAGNOSIS — O479 False labor, unspecified: Secondary | ICD-10-CM | POA: Insufficient documentation

## 2017-11-23 LAB — CBC
HCT: 35.6 % — ABNORMAL LOW (ref 36.0–46.0)
Hemoglobin: 11.6 g/dL — ABNORMAL LOW (ref 12.0–15.0)
MCH: 29.3 pg (ref 26.0–34.0)
MCHC: 32.6 g/dL (ref 30.0–36.0)
MCV: 89.9 fL (ref 78.0–100.0)
PLATELETS: 316 10*3/uL (ref 150–400)
RBC: 3.96 MIL/uL (ref 3.87–5.11)
RDW: 14.1 % (ref 11.5–15.5)
WBC: 12.8 10*3/uL — ABNORMAL HIGH (ref 4.0–10.5)

## 2017-11-23 LAB — RPR: RPR: NONREACTIVE

## 2017-11-23 LAB — ABO/RH: ABO/RH(D): A POS

## 2017-11-23 LAB — TYPE AND SCREEN
ABO/RH(D): A POS
Antibody Screen: NEGATIVE

## 2017-11-23 MED ORDER — DIPHENHYDRAMINE HCL 25 MG PO CAPS: 25.0000 mg | ORAL_CAPSULE | Freq: Four times a day (QID) | ORAL | Status: DC | PRN

## 2017-11-23 MED ORDER — ACETAMINOPHEN 325 MG PO TABS: 650.0000 mg | ORAL_TABLET | ORAL | Status: DC | PRN

## 2017-11-23 MED ORDER — BENZOCAINE-MENTHOL 20-0.5 % EX AERO
1.0000 "application " | INHALATION_SPRAY | CUTANEOUS | Status: DC | PRN
  Administered 2017-11-23 – 2017-11-24 (×2): 1 via TOPICAL
  Filled 2017-11-23 (×2): qty 56

## 2017-11-23 MED ORDER — ONDANSETRON HCL 4 MG PO TABS: 4.0000 mg | ORAL_TABLET | ORAL | Status: DC | PRN

## 2017-11-23 MED ORDER — LACTATED RINGERS IV SOLN: 500.0000 mL | INTRAVENOUS | Status: DC | PRN

## 2017-11-23 MED ORDER — OXYCODONE-ACETAMINOPHEN 5-325 MG PO TABS: 2.0000 | ORAL_TABLET | ORAL | Status: DC | PRN

## 2017-11-23 MED ORDER — SENNOSIDES-DOCUSATE SODIUM 8.6-50 MG PO TABS
2.0000 | ORAL_TABLET | ORAL | Status: DC
  Administered 2017-11-24 (×2): 2 via ORAL
  Filled 2017-11-23 (×2): qty 2

## 2017-11-23 MED ORDER — ZOLPIDEM TARTRATE 5 MG PO TABS: 5.0000 mg | ORAL_TABLET | Freq: Every evening | ORAL | Status: DC | PRN

## 2017-11-23 MED ORDER — SOD CITRATE-CITRIC ACID 500-334 MG/5ML PO SOLN: 30.0000 mL | ORAL | Status: DC | PRN

## 2017-11-23 MED ORDER — LIDOCAINE HCL (PF) 1 % IJ SOLN
30.0000 mL | INTRAMUSCULAR | Status: DC | PRN
  Administered 2017-11-23: 30 mL via SUBCUTANEOUS
  Filled 2017-11-23 (×2): qty 30

## 2017-11-23 MED ORDER — IBUPROFEN 600 MG PO TABS
600.0000 mg | ORAL_TABLET | Freq: Four times a day (QID) | ORAL | Status: DC
  Administered 2017-11-23 – 2017-11-25 (×8): 600 mg via ORAL
  Filled 2017-11-23 (×8): qty 1

## 2017-11-23 MED ORDER — PRENATAL MULTIVITAMIN CH
1.0000 | ORAL_TABLET | Freq: Every day | ORAL | Status: DC
  Administered 2017-11-24: 1 via ORAL
  Filled 2017-11-23: qty 1

## 2017-11-23 MED ORDER — OXYCODONE-ACETAMINOPHEN 5-325 MG PO TABS: 1.0000 | ORAL_TABLET | ORAL | Status: DC | PRN

## 2017-11-23 MED ORDER — SIMETHICONE 80 MG PO CHEW: 80.0000 mg | CHEWABLE_TABLET | ORAL | Status: DC | PRN

## 2017-11-23 MED ORDER — COCONUT OIL OIL
1.0000 "application " | TOPICAL_OIL | Status: DC | PRN
  Administered 2017-11-24: 1 via TOPICAL
  Filled 2017-11-23: qty 120

## 2017-11-23 MED ORDER — DIBUCAINE 1 % RE OINT: 1.0000 "application " | TOPICAL_OINTMENT | RECTAL | Status: DC | PRN

## 2017-11-23 MED ORDER — OXYTOCIN BOLUS FROM INFUSION
500.0000 mL | Freq: Once | INTRAVENOUS | Status: AC
  Administered 2017-11-23: 500 mL via INTRAVENOUS

## 2017-11-23 MED ORDER — OXYTOCIN 40 UNITS IN LACTATED RINGERS INFUSION - SIMPLE MED
2.5000 [IU]/h | INTRAVENOUS | Status: DC
  Administered 2017-11-23: 2.5 [IU]/h via INTRAVENOUS
  Filled 2017-11-23: qty 1000

## 2017-11-23 MED ORDER — ONDANSETRON HCL 4 MG/2ML IJ SOLN: 4.0000 mg | INTRAMUSCULAR | Status: DC | PRN

## 2017-11-23 MED ORDER — TETANUS-DIPHTH-ACELL PERTUSSIS 5-2.5-18.5 LF-MCG/0.5 IM SUSP: 0.5000 mL | Freq: Once | INTRAMUSCULAR | Status: DC

## 2017-11-23 MED ORDER — LACTATED RINGERS IV SOLN: INTRAVENOUS | Status: DC

## 2017-11-23 MED ORDER — ONDANSETRON HCL 4 MG/2ML IJ SOLN: 4.0000 mg | Freq: Four times a day (QID) | INTRAMUSCULAR | Status: DC | PRN

## 2017-11-23 MED ORDER — WITCH HAZEL-GLYCERIN EX PADS: 1.0000 "application " | MEDICATED_PAD | CUTANEOUS | Status: DC | PRN

## 2017-11-23 NOTE — H&P (Signed)
Denise Mercado is a 32 y.o. female presenting for contractions which started 2 hours ago.  Followed at [facility] office and plans waterbirth.  RN Note: CTX every 2-3 mins for the past 2 hours.  No LOF/VB.  Reports good FM.  Last VE 1 week ago- 1.5 cm     . OB History    Gravida  1   Para      Term      Preterm      AB      Living        SAB      TAB      Ectopic      Multiple      Live Births             Past Medical History:  Diagnosis Date  . Supervision of normal first pregnancy, antepartum 04/25/2017    [facility] [place] Prenatal Labs Dating Bedside U/S 9 Blood type: A/Positive/-- (10/05 1113)  Genetic Screen 1 Screen:    AFP:     Quad:     NIPS: Planning Antibody:Negative (10/05 1113) Anatomic [place]  Rubella: @ GTT Early:               Third trimester:  RPR: Non Reactive (10/05 1113)  Flu vaccine 04/25/17 HBsAg: Negative (10/05 1113)  TDaP vaccine                                               Rhogam: HIV:   NR Baby Food Breast                                            GBS: (For PCN allergy, check sensitivities) Contraception  Pap: Nml 2018 Circumcision  Baby Scripts Opt sched Pediatrician  CF: POSITIVE  Spouse-CF neg Support Person Sam SMA Prenatal Classes Offered Hgb electrophoresis:    Past Surgical History:  Procedure Laterality Date  . WISDOM TOOTH EXTRACTION     Family History: family history includes Alzheimer's disease in her maternal grandmother; Colon cancer in her maternal grandfather and paternal grandfather; Diabetes in her maternal grandfather; Heart disease in her paternal grandfather; High Cholesterol in her maternal grandmother; Hypertension in her maternal grandfather and maternal grandmother; Lung cancer in her paternal grandfather. Social History:  reports that she has never smoked. She has never used smokeless tobacco. She reports that she does not drink alcohol or use drugs.     Maternal Diabetes: No Genetic Screening:  Normal Maternal Ultrasounds/Referrals: Normal Fetal Ultrasounds or other Referrals:  None Maternal Substance Abuse:  No Significant Maternal Medications:  None Significant Maternal Lab Results:  Lab values include: Group B Strep negative Other Comments:  None  Review of Systems  Constitutional: Negative for chills and fever.  Gastrointestinal: Positive for abdominal pain. Negative for constipation, diarrhea, nausea and vomiting.  Neurological: Negative for dizziness, focal weakness and headaches.   Maternal Medical History:  Reason for admission: Contractions.  Nausea.  Contractions: Onset was 1-2 hours ago.   Frequency: regular.   Perceived severity is moderate.    Fetal activity: Perceived fetal activity is normal.   Last perceived fetal movement was within the past hour.    Prenatal complications: No bleeding, PIH, pre-eclampsia or preterm labor.   Prenatal  Complications - Diabetes: none.    Dilation: 5 Effacement (%): 80 Station: -2, -1 Exam by:: [employer], RN Blood pressure 132/86, pulse 88, temperature 98.4 F (36.9 C), resp. rate 20, height  (1.702 m), weight 165 lb (74.8 kg), last menstrual period 02/15/2017. Maternal Exam:  Uterine Assessment: Contraction strength is firm.  Contraction frequency is regular.   Abdomen: Patient reports no abdominal tenderness. Fetal presentation: vertex  Introitus: Normal vulva. Normal vagina.  Ferning test: not done.  Nitrazine test: not done.  Pelvis: adequate for delivery.   Cervix: Cervix evaluated by digital exam.     Fetal Exam Fetal Monitor Review: Mode: ultrasound.   Baseline rate: 140.  Variability: moderate (6-25 bpm).   Pattern: accelerations present and no decelerations.    Fetal State Assessment: Category I - tracings are normal.     Physical Exam  Constitutional: She is oriented to person, place, and time. She appears well-developed and well-nourished. No distress.  HENT:  Head: Normocephalic.   Cardiovascular: Normal rate.  Respiratory: Effort normal. No respiratory distress.  GI: Soft. She exhibits no distension. There is no tenderness. There is no rebound and no guarding.  Genitourinary:  Genitourinary Comments: Dilation: 5 Effacement (%): 80 Cervical Position: Anterior Station: -2, -1 Presentation: Vertex Exam by:: Latricia Heft, RN   Musculoskeletal: Normal range of motion.  Neurological: She is alert and oriented to person, place, and time.  Skin: Skin is warm and dry.  Psychiatric: She has a normal mood and affect.    Prenatal labs: ABO, Rh: A/Positive/-- (10/05 1113) Antibody: Negative (10/05 1113) Rubella: 3.97 (10/05 1113) RPR: NON-REACTIVE (02/22 0813)  HBsAg: Negative (10/05 1113)  HIV: NON-REACTIVE (02/22 0813)  GBS:     Assessment/Plan: Single intrauterine pregnancy at [redacted]w[redacted]d Active Labor  Admit to [facility] suites Routine orders Planning waterbirth using Labor ladies Anticipate SVD   Wynelle Bourgeois 11/23/2017, 5:47 AM

## 2017-11-23 NOTE — Progress Notes (Signed)
   Denise Mercado is a 32 y.o. G1P0 at [redacted]w[redacted]d  admitted for active labor  Subjective:  Doing well, in tube.  Objective: Vitals:   11/23/17 0615 11/23/17 0707 11/23/17 0726 11/23/17 0940  BP: 126/82 113/70 129/80 129/69  Pulse: 99 90 79 (!) 112  Resp: Temp: 97.6 F (36.4 C)  97.6 F (36.4 C)   TempSrc: Oral  Oral   Weight:      Height:       No intake/output data recorded.  FHT:  FHR: 150 bpm, variability: moderate,  accelerations:  Abscent,  decelerations:  Absent UC:   regular, every 3-4 minutes SVE:   Dilation: 10 Effacement (%): 100 Station: 0 Exam by:: J.Follmer,RNC   Labs: Lab Results  Component Value Date   WBC 12.8 (H) 11/23/2017   HGB 11.6 (L) 11/23/2017   HCT 35.6 (L) 11/23/2017   MCV 89.9 11/23/2017   PLT 316 11/23/2017    Assessment / Plan: Spontaneous labor, progressing normally AROM clear fluid at 1049 Labor: Progressing normally Fetal Wellbeing:  Category I Pain Control:  Labor support without medications Anticipated MOD:  NSVD  Charlesetta Garibaldi Kooistra 11/23/2017, 10:50 AM

## 2017-11-23 NOTE — MAU Note (Signed)
CTX every 2-3 mins for the past 2 hours.  No LOF/VB.  Reports good FM.  Last VE 1 week ago- 1.5 cm

## 2017-11-23 NOTE — Anesthesia Pain Management Evaluation Note (Signed)
  CRNA Pain Management Visit Note  Patient: Denise Mercado, 32 y.o., female  "Hello I am a member of the anesthesia team at [facility]. We have an anesthesia team available at all times to provide care throughout the hospital, including epidural management and anesthesia for C-section. I don't know your plan for the delivery whether it a natural birth, water birth, IV sedation, nitrous supplementation, doula or epidural, but we want to meet your pain goals."   1.Was your pain managed to your expectations on prior hospitalizations?   No prior hospitalizations  2.What is your expectation for pain management during this hospitalization?     Water birth  3.How can we help you reach that goal? Support prn  Record the patient's initial score and the patient's pain goal.   Pain: 7  Pain Goal: 8 [facility] wants you to be able to say your pain was always managed very well.  [facility] 11/23/2017

## 2017-11-24 ENCOUNTER — Encounter: Payer: 59 | Admitting: Advanced Practice Midwife

## 2017-11-24 LAB — CBC
HCT: 29.9 % — ABNORMAL LOW (ref 36.0–46.0)
Hemoglobin: 9.8 g/dL — ABNORMAL LOW (ref 12.0–15.0)
MCH: 29.3 pg (ref 26.0–34.0)
MCHC: 32.8 g/dL (ref 30.0–36.0)
MCV: 89.5 fL (ref 78.0–100.0)
PLATELETS: 267 10*3/uL (ref 150–400)
RBC: 3.34 MIL/uL — ABNORMAL LOW (ref 3.87–5.11)
RDW: 14.3 % (ref 11.5–15.5)
WBC: 14.9 10*3/uL — ABNORMAL HIGH (ref 4.0–10.5)

## 2017-11-24 NOTE — Progress Notes (Signed)
Post Partum Day 1 Subjective: no complaints  Denise Mercado is a 32 year old G1P1001 s/p SVD at [redacted]w[redacted]d gestation. Patient reports she is doing well with no complaints. She had no events overnight. Patient reports some difficulties with breastfeeding due to baby falling asleep during each feeding. Patient is tolerating po well and ambulating without difficulty. Lochia moderate. She denies shortness of breath, chest pain, and abdominal pain.  Objective: Blood pressure 114/72, pulse 78, temperature 97.7 F (36.5 C), temperature source Oral, resp. rate 18, height  (1.702 m), weight 74.8 kg (165 lb), last menstrual period 02/15/2017, SpO2 100 %, unknown if currently breastfeeding.  Physical Exam:  General: alert, cooperative, appears stated age and no distress  Chest: clear to auscultation bilaterally, no respiratory distress Heart: regular rate and rhythm Abdomen: soft, nontender Lochia: appropriate Uterine Fundus: firm Incision: N/A DVT Evaluation: No evidence of DVT seen on physical exam.  Recent Labs    11/23/17 0555 11/24/17 0531  HGB 11.6* 9.8*  HCT 35.6* 29.9*    Assessment/Plan: Plan for discharge tomorrow, Discharge home, Breastfeeding, Lactation consult and Contraception IUD -Consult lactation due to difficulties with baby staying awake during feedings -Patient plans to get IUD insert during post-partum visit   LOS: 1 day   Caprice Renshaw, PA-S 11/24/2017, 7:57 AM

## 2017-11-24 NOTE — Lactation Note (Signed)
This note was copied from a baby's chart. Lactation Consultation Note  Patient Name: Denise Mercado [id number] Date: 11/24/2017 Reason for consult: Follow-up assessment  Visited with P1 Mom of term infant.  Mom states breastfeeding has been going well.  Her nipples are tender, slightly reddened on end of nipple.  Mom has small, firm breasts with erect nipples. Baby noted to be cueing to eat.  Mom agreeable to [facility] assisting and assessing positioning and latch.   Encouraged Mom to do some breast massage and reviewed hand expression.  Colostrum easily expressed.   With assistance, baby able to attain a deep latch onto areola.  A few attempts prior to this, baby was latching too shallow, and pinching nipple.  Showed mom how to sandwich breast in U hold at base of her breast.  Showed FOB how to gently pull on chin to open her mouth wider.  A few swallows identified. Baby came off breast after 10 mins, and watched as Mom positioned and latched baby independently.  Mom did very well, and baby latched and sucked more vigorously, with swallows identified.    Encouraged STS and feeding baby often on cue.  Goal of >8 feedings per 24 hrs. Coconut oil given with instructions on use for sore nipples.  Maternal Data Formula Feeding for Exclusion: No Has patient been taught Hand Expression?: Yes Does the patient have breastfeeding experience prior to this delivery?: No  Feeding Feeding Type: Breast Fed Length of feed: 10 min  LATCH Score Latch: Grasps breast easily, tongue down, lips flanged, rhythmical sucking.  Audible Swallowing: Spontaneous and intermittent  Type of Nipple: Everted at rest and after stimulation  Comfort (Breast/Nipple): Filling, red/small blisters or bruises, mild/mod discomfort  Hold (Positioning): No assistance needed to correctly position infant at breast.  LATCH Score: 9  Interventions Interventions: Breast feeding basics reviewed;Assisted with latch;Skin to skin;Breast  massage;Hand express;Breast compression;Adjust position;Support pillows;Position options;Expressed milk;Coconut oil  Lactation Tools Discussed/Used Tools: Coconut oil   Consult Status Consult Status: Follow-up Date: 11/25/17 Follow-up type: In-patient    Judee Clara 11/24/2017, 4:01 PM

## 2017-11-25 MED ORDER — IBUPROFEN 600 MG PO TABS: 600.0000 mg | ORAL_TABLET | Freq: Four times a day (QID) | ORAL | 0 refills | Status: AC | PRN

## 2017-11-25 NOTE — Discharge Summary (Signed)
OB Discharge Summary     Patient Name: Denise Mercado DOB: [birth year] MRN: [id number]  Date of admission: 11/23/2017 Delivering MD: Marylene Land   Date of discharge: 11/25/2017  Admitting diagnosis: 40 wks labor Intrauterine pregnancy: 109w1d     Secondary diagnosis:  Principal Problem:   Supervision of normal first pregnancy, antepartum Active Problems:   Cystic fibrosis carrier, antepartum  Additional problems: GBS neg     Discharge diagnosis: Term Pregnancy Delivered - waterbirth                                                                                               Post partum procedures:none  Augmentation: AROM  Complications: None  Hospital course:  Onset of Labor With Vaginal Delivery     32 y.o. yo G1P1001 at [redacted]w[redacted]d was admitted in Latent Labor on 11/23/2017. Patient had an uncomplicated waterbirth augmented only with AROM.  Membrane Rupture Time/Date: 10:40 AM ,11/23/2017   Intrapartum Procedures: Episiotomy: None [1]                                         Lacerations:  2nd degree [3]  Patient had a delivery of a Viable infant. 11/23/2017  Information for the patient's newborn:  Endora, Teresi [[id number]]  Delivery Method: Vaginal, Spontaneous(Filed from Delivery Summary)    Pateint had an uncomplicated postpartum course.  She is ambulating, tolerating a regular diet, passing flatus, and urinating well. Patient is discharged home in stable condition on 11/25/17.   Physical exam  Vitals:   11/24/17 0547 11/24/17 1800 11/24/17 2030 11/25/17 0520  BP: 114/72 125/76 108/64 107/65  Pulse: 78 (!) 130 99 99  Resp: Temp: 97.7 F (36.5 C) 98.3 F (36.8 C) 98.7 F (37.1 C) 98.4 F (36.9 C)  TempSrc: Oral Oral Oral Oral  SpO2: 100% 97% 100% 100%  Weight:      Height:       General: alert and cooperative Lochia: appropriate Uterine Fundus: firm Incision: N/A DVT Evaluation: No evidence of DVT seen on physical exam. Labs: Lab Results   Component Value Date   WBC 14.9 (H) 11/24/2017   HGB 9.8 (L) 11/24/2017   HCT 29.9 (L) 11/24/2017   MCV 89.5 11/24/2017   PLT 267 11/24/2017   No flowsheet data found.  Discharge instruction: per After Visit Summary and "Baby and Me Booklet".  After visit meds:  Allergies as of 11/25/2017   No Known Allergies     Medication List    STOP taking these medications   COMFORT FIT MATERNITY SUPP MED Misc   Doxylamine-Pyridoxine 10-10 MG Tbec Commonly known as:  DICLEGIS     TAKE these medications   ibuprofen 600 MG tablet Commonly known as:  ADVIL,MOTRIN Take 1 tablet (600 mg total) by mouth every 6 (six) hours as needed.   PRENATAL VITAMIN PO Take 1 tablet by mouth daily.       Diet: routine diet  Activity: Advance as tolerated. Pelvic rest  for 6 weeks.   Outpatient follow up:4 weeks Follow up Appt:No future appointments. Follow up Visit:No follow-ups on file.  Postpartum contraception: IUD Mirena  Newborn Data: Live born female  Birth Weight: 8 lb 2.2 oz (3690 g) APGAR: 6, 9  Newborn Delivery   Birth date/time:  11/23/2017 11:34:00 Delivery type:  Vaginal, Spontaneous     Baby Feeding: Breast Disposition:home with mother   11/25/2017 Cam Hai, CNM  10:25 AM

## 2017-11-25 NOTE — Lactation Note (Signed)
This note was copied from a baby's chart. Lactation Consultation Note  Patient Name: Denise Mercado [id number] Date: 11/25/2017   [facility] Follow Up Visit:  G1P1 mother whose infant is now 77 hours old.  As I entered mother was placing infant back in bassinet.  She had just completed breastfeeding.  However, infant still awake and showing feeding cues.  I offered to assist with a latch and mother agreed.  Mother has small breasts with erect nipples.  She states her nipples are tender but they are intact, rounded and only slightly reddened.  Mother demonstrated latching baby onto the right breast in the cross cradle hold and baby latched well.  She had the hiccups during the first part of her feeding but was able to maintain a latch.  Her lips were flanged and rhythmic sucking noted.  Mother felt comfortable without pain.    Reviewed STS, breast massage and hand expression, how to obtain a deep latch and to feed 8-12 times/24 hours or more if infant shows cues.  Engorgement prevention/treatment discussed.    Mother has a DEBP for home use.  Mom made aware of O/P services, breastfeeding support groups, community resources, and our phone # for post-discharge questions.                 Denise Mercado 11/25/2017, 6:38 AM

## 2017-11-25 NOTE — Discharge Instructions (Signed)
Vaginal Delivery, Care After °Refer to this sheet in the next few weeks. These instructions provide you with information about caring for yourself after vaginal delivery. Your health care provider may also give you more specific instructions. Your treatment has been planned according to current medical practices, but problems sometimes occur. Call your health care provider if you have any problems or questions. °What can I expect after the procedure? °After vaginal delivery, it is common to have: °· Some bleeding from your vagina. °· Soreness in your abdomen, your vagina, and the area of skin between your vaginal opening and your anus (perineum). °· Pelvic cramps. °· Fatigue. ° °Follow these instructions at home: °Medicines °· Take over-the-counter and prescription medicines only as told by your health care provider. °· If you were prescribed an antibiotic medicine, take it as told by your health care provider. Do not stop taking the antibiotic until it is finished. °Driving ° °· Do not drive or operate heavy machinery while taking prescription pain medicine. °· Do not drive for 24 hours if you received a sedative. °Lifestyle °· Do not drink alcohol. This is especially important if you are breastfeeding or taking medicine to relieve pain. °· Do not use tobacco products, including cigarettes, chewing tobacco, or e-cigarettes. If you need help quitting, ask your health care provider. °Eating and drinking °· Drink at least 8 eight-ounce glasses of water every day unless you are told not to by your health care provider. If you choose to breastfeed your baby, you may need to drink more water than this. °· Eat high-fiber foods every day. These foods may help prevent or relieve constipation. High-fiber foods include: °? Whole grain cereals and breads. °? Brown rice. °? Beans. °? Fresh fruits and vegetables. °Activity °· Return to your normal activities as told by your health care provider. Ask your health care provider  what activities are safe for you. °· Rest as much as possible. Try to rest or take a nap when your baby is sleeping. °· Do not lift anything that is heavier than your baby or 10 lb (4.5 kg) until your health care provider says that it is safe. °· Talk with your health care provider about when you can engage in sexual activity. This may depend on your: °? Risk of infection. °? Rate of healing. °? Comfort and desire to engage in sexual activity. °Vaginal Care °· If you have an episiotomy or a vaginal tear, check the area every day for signs of infection. Check for: °? More redness, swelling, or pain. °? More fluid or blood. °? Warmth. °? Pus or a bad smell. °· Do not use tampons or douches until your health care provider says this is safe. °· Watch for any blood clots that may pass from your vagina. These may look like clumps of dark red, brown, or black discharge. °General instructions °· Keep your perineum clean and dry as told by your health care provider. °· Wear loose, comfortable clothing. °· Wipe from front to back when you use the toilet. °· Ask your health care provider if you can shower or take a bath. If you had an episiotomy or a perineal tear during labor and delivery, your health care provider may tell you not to take baths for a certain length of time. °· Wear a bra that supports your breasts and fits you well. °· If possible, have someone help you with household activities and help care for your baby for at least a few days after   you leave the hospital. °· Keep all follow-up visits for you and your baby as told by your health care provider. This is important. °Contact a health care provider if: °· You have: °? Vaginal discharge that has a bad smell. °? Difficulty urinating. °? Pain when urinating. °? A sudden increase or decrease in the frequency of your bowel movements. °? More redness, swelling, or pain around your episiotomy or vaginal tear. °? More fluid or blood coming from your episiotomy or  vaginal tear. °? Pus or a bad smell coming from your episiotomy or vaginal tear. °? A fever. °? A rash. °? Little or no interest in activities you used to enjoy. °? Questions about caring for yourself or your baby. °· Your episiotomy or vaginal tear feels warm to the touch. °· Your episiotomy or vaginal tear is separating or does not appear to be healing. °· Your breasts are painful, hard, or turn red. °· You feel unusually sad or worried. °· You feel nauseous or you vomit. °· You pass large blood clots from your vagina. If you pass a blood clot from your vagina, save it to show to your health care provider. Do not flush blood clots down the toilet without having your health care provider look at them. °· You urinate more than usual. °· You are dizzy or light-headed. °· You have not breastfed at all and you have not had a menstrual period for 12 weeks after delivery. °· You have stopped breastfeeding and you have not had a menstrual period for 12 weeks after you stopped breastfeeding. °Get help right away if: °· You have: °? Pain that does not go away or does not get better with medicine. °? Chest pain. °? Difficulty breathing. °? Blurred vision or spots in your vision. °? Thoughts about hurting yourself or your baby. °· You develop pain in your abdomen or in one of your legs. °· You develop a severe headache. °· You faint. °· You bleed from your vagina so much that you fill two sanitary pads in one hour. °This information is not intended to replace advice given to you by your health care provider. Make sure you discuss any questions you have with your health care provider. °Document Released: 07/05/2000 Document Revised: 12/20/2015 Document Reviewed: 07/23/2015 °Elsevier Interactive Patient Education © 2018 [employer] ° °

## 2017-12-09 ENCOUNTER — Telehealth: Payer: Self-pay | Admitting: Student

## 2017-12-18 ENCOUNTER — Ambulatory Visit (INDEPENDENT_AMBULATORY_CARE_PROVIDER_SITE_OTHER): Payer: 59 | Admitting: Obstetrics & Gynecology

## 2017-12-18 ENCOUNTER — Encounter: Payer: Self-pay | Admitting: Obstetrics & Gynecology

## 2017-12-18 DIAGNOSIS — Z30430 Encounter for insertion of intrauterine contraceptive device: Secondary | ICD-10-CM

## 2017-12-18 DIAGNOSIS — Z3202 Encounter for pregnancy test, result negative: Secondary | ICD-10-CM | POA: Diagnosis not present

## 2017-12-18 DIAGNOSIS — Z1389 Encounter for screening for other disorder: Secondary | ICD-10-CM

## 2017-12-18 LAB — POCT URINE PREGNANCY: Preg Test, Ur: NEGATIVE

## 2017-12-18 MED ORDER — LEVONORGESTREL 20 MCG/24HR IU IUD
INTRAUTERINE_SYSTEM | Freq: Once | INTRAUTERINE | Status: AC
  Administered 2017-12-18: 15:00:00 via INTRAUTERINE

## 2017-12-18 NOTE — Progress Notes (Signed)
Post Partum Exam  Denise Mercado is a 32 y.o. [id number] female who presents for a postpartum visit. She is 3 weeks 4 days postpartum following a spontaneous vaginal delivery. I have fully reviewed the prenatal and intrapartum course. The delivery was at 39 weeks 5 days. Anesthesia: none. Postpartum course has been unremarkable. Baby's course has been unremarkable. Baby is feeding by breast. Bleeding staining only. Bowel function is normal. Bladder function is normal. Patient is not sexually active. Contraception method is IUD. Postpartum depression screening:neg  The following portions of the patient's history were reviewed and updated as appropriate: allergies, current medications, past family history, past medical history, past social history, past surgical history and problem list. Last pap smear done 10/18 and was Normal  Review of Systems Pertinent items are noted in HPI.    Objective:  Last menstrual period 02/15/2017, unknown if currently breastfeeding.  General:  alert   Breasts:  inspection negative, no nipple discharge or bleeding, no masses or nodularity palpable  Lungs: clear to auscultation bilaterally  Heart:  regular rate and rhythm, S1, S2 normal, no murmur, click, rub or gallop  Abdomen: soft, non-tender; bowel sounds normal; no masses,  no organomegaly   Vulva:  normal  Vagina: normal vagina  Cervix:  anteverted  Corpus: normal  Adnexa:  no mass, fullness, tenderness  Rectal Exam: Not performed.       UPT negative, consent signed, Time out procedure done. Cervix prepped with betadine and grasped with a single tooth tenaculum. Mirena was easily placed and the strings were cut to 3-4 cm. Uterus sounded to 9 cm. She tolerated the procedure well.   Assessment:    Normal postpartum exam. Pap smear not done at today's visit.   Plan:   1. Contraception: IUD Pap smear in 3 years String check in 4 weeks prn
